# Patient Record
Sex: Male | Born: 1980 | Race: White | Hispanic: No | Marital: Single | Smoking: Current every day smoker
Health system: Southern US, Community
[De-identification: ages and names within clinical notes are randomized; demographics above are authoritative.]

## PROBLEM LIST (undated history)

## (undated) ENCOUNTER — Telehealth

## (undated) ENCOUNTER — Ambulatory Visit: Payer: PRIVATE HEALTH INSURANCE | Attending: Internal Medicine | Primary: Internal Medicine

## (undated) ENCOUNTER — Encounter

## (undated) ENCOUNTER — Ambulatory Visit: Payer: Medicaid (Managed Care)

## (undated) ENCOUNTER — Encounter: Attending: Mental Health | Primary: Mental Health

## (undated) ENCOUNTER — Ambulatory Visit: Payer: PRIVATE HEALTH INSURANCE

## (undated) ENCOUNTER — Ambulatory Visit

## (undated) ENCOUNTER — Ambulatory Visit: Payer: PRIVATE HEALTH INSURANCE | Attending: Adult Health | Primary: Adult Health

## (undated) ENCOUNTER — Encounter
Attending: Student in an Organized Health Care Education/Training Program | Primary: Student in an Organized Health Care Education/Training Program

## (undated) ENCOUNTER — Telehealth
Attending: Pharmacist Clinician (PhC)/ Clinical Pharmacy Specialist | Primary: Pharmacist Clinician (PhC)/ Clinical Pharmacy Specialist

## (undated) ENCOUNTER — Other Ambulatory Visit

## (undated) ENCOUNTER — Encounter: Attending: Family | Primary: Family

## (undated) ENCOUNTER — Encounter: Attending: Internal Medicine | Primary: Internal Medicine

## (undated) ENCOUNTER — Encounter
Attending: Pharmacist Clinician (PhC)/ Clinical Pharmacy Specialist | Primary: Pharmacist Clinician (PhC)/ Clinical Pharmacy Specialist

## (undated) ENCOUNTER — Encounter: Payer: PRIVATE HEALTH INSURANCE | Attending: Mental Health | Primary: Mental Health

## (undated) ENCOUNTER — Non-Acute Institutional Stay: Payer: PRIVATE HEALTH INSURANCE

## (undated) ENCOUNTER — Ambulatory Visit: Payer: PRIVATE HEALTH INSURANCE | Attending: Family | Primary: Family

## (undated) ENCOUNTER — Encounter: Payer: PRIVATE HEALTH INSURANCE | Attending: Gastroenterology | Primary: Gastroenterology

## (undated) ENCOUNTER — Ambulatory Visit: Attending: Mental Health | Primary: Mental Health

## (undated) DIAGNOSIS — M5430 Sciatica, unspecified side: Secondary | ICD-10-CM

## (undated) DIAGNOSIS — K760 Fatty (change of) liver, not elsewhere classified: Secondary | ICD-10-CM

## (undated) DIAGNOSIS — F1911 Other psychoactive substance abuse, in remission: Secondary | ICD-10-CM

## (undated) HISTORY — PX: WRIST FRACTURE SURGERY: SHX121

## (undated) HISTORY — PX: MOUTH SURGERY: SHX715

---

## 2010-01-28 ENCOUNTER — Emergency Department: Payer: Self-pay | Admitting: Emergency Medicine

## 2011-02-16 ENCOUNTER — Emergency Department: Payer: Self-pay | Admitting: *Deleted

## 2011-03-21 ENCOUNTER — Emergency Department (HOSPITAL_COMMUNITY)
Admission: EM | Admit: 2011-03-21 | Discharge: 2011-03-21 | Discharge status: Against Medical Advice | Payer: Self-pay | Attending: Emergency Medicine | Admitting: Emergency Medicine

## 2011-03-21 ENCOUNTER — Other Ambulatory Visit: Payer: Self-pay

## 2011-03-21 ENCOUNTER — Emergency Department (HOSPITAL_COMMUNITY): Payer: Self-pay

## 2011-03-21 DIAGNOSIS — R4182 Altered mental status, unspecified: Secondary | ICD-10-CM | POA: Insufficient documentation

## 2011-03-21 DIAGNOSIS — R0681 Apnea, not elsewhere classified: Secondary | ICD-10-CM | POA: Insufficient documentation

## 2011-03-21 DIAGNOSIS — F17200 Nicotine dependence, unspecified, uncomplicated: Secondary | ICD-10-CM | POA: Insufficient documentation

## 2011-03-21 DIAGNOSIS — K029 Dental caries, unspecified: Secondary | ICD-10-CM | POA: Insufficient documentation

## 2011-03-21 LAB — DIFFERENTIAL
Eosinophils Relative: 1 % (ref 0–5)
Lymphocytes Relative: 50 % — ABNORMAL HIGH (ref 12–46)
Monocytes Relative: 7 % (ref 3–12)
Neutrophils Relative %: 42 % — ABNORMAL LOW (ref 43–77)

## 2011-03-21 LAB — BASIC METABOLIC PANEL
CO2: 26 mEq/L (ref 19–32)
Calcium: 9.1 mg/dL (ref 8.4–10.5)
Chloride: 102 mEq/L (ref 96–112)
Potassium: 3.2 mEq/L — ABNORMAL LOW (ref 3.5–5.1)
Sodium: 137 mEq/L (ref 135–145)

## 2011-03-21 LAB — CBC
Hemoglobin: 13 g/dL (ref 13.0–17.0)
Platelets: 312 10*3/uL (ref 150–400)
RBC: 4.48 MIL/uL (ref 4.22–5.81)
WBC: 16.2 10*3/uL — ABNORMAL HIGH (ref 4.0–10.5)

## 2011-03-21 LAB — ETHANOL: Alcohol, Ethyl (B): <11 mg/dL (ref 0–11)

## 2011-03-21 MED ORDER — ZIPRASIDONE MESYLATE 20 MG IM SOLR
INTRAMUSCULAR | Status: AC
  Administered 2011-03-21: 18:00:00
  Filled 2011-03-21: qty 20

## 2011-03-21 MED ORDER — ZIPRASIDONE MESYLATE 20 MG IM SOLR
20.0000 mg | Freq: Once | INTRAMUSCULAR | Status: DC
  Filled 2011-03-21: qty 20

## 2011-03-21 NOTE — ED Notes (Signed)
Pt arrived with friends who found him unresponsive at home.  When pt first arrived he was ashen colored and had very shallow respirations.  He was slightly responsive to sternal rub.  He started to wake up quickly and was combative and hostile. He tore his cardiac leads off and his IV out. He was restrained temporarily for his safety and ours.  Security was also at the bedside.  Dr, Effie Shy was at the bedside.  O2 sats were 95% on room air after he woke up. Pt was continually reoriented to where he was and why he was here.

## 2011-03-21 NOTE — ED Notes (Signed)
Brought in by friends who have said they have been calling him all day.   Friends went to his home and found him unresponsive.

## 2011-03-21 NOTE — ED Notes (Signed)
Pt left without doing the discharge process.  He came back at 1955 to get his money that is locked in the safe.  Security was notified that he was here to get his money.

## 2011-03-21 NOTE — ED Notes (Signed)
Pts friends said that they tried to call him all day and he didn't answer so they rode by there and found him in the floor unresponsive, they put him in the car and drove him to the ED

## 2011-03-21 NOTE — ED Notes (Signed)
Pt unable to give a urine sample at this time.

## 2011-03-21 NOTE — ED Provider Notes (Signed)
History     CSN: [id number] Arrival date & time: 03/21/2011  4:57 PM   None   Level 5 Caveat Patient presented to the emergency room by friends who drove up to the ambulance bay. He was put in a wheelchair transferred immediately to the resuscitation bay. Initial assessment revealed he was apneic and pale with a grayish appearance. Palpable pulse in the groin in the neck and the heart rate greater than 100 auscultation and there was no visible trauma on his body. Initial treatment included 100% oxygen by face mask and bagging by me. An IV was obtained by nursing with blood obtained and a CBG was 183. After about 4 minutes of bagging the patient opened his eyes and looked around about 30 seconds after that, he sat up and began talking. He was confused and unable to cooperate; he required physical restraint and then had to be restrained with wrist and ankle devices. I ordered Geodon to control the situation until he could be further assessed.    Chief Complaint  Patient presents with  . Altered Mental Status    unresponsive    (Consider location/radiation/quality/duration/timing/severity/associated sxs/prior treatment) Patient is a 30 y.o. male presenting with altered mental status. The history is provided by the patient.  Altered Mental Status    No past medical history on file.  No past surgical history on file.  No family history on file.  History  Substance Use Topics  . Smoking status: Current Everyday Smoker  . Smokeless tobacco: Not on file  . Alcohol Use: Yes      Review of Systems  Unable to perform ROS Psychiatric/Behavioral: Positive for altered mental status.    Allergies  Acetaminophen  Home Medications  No current outpatient prescriptions on file.  BP 124/64  Pulse 104  Temp(Src) 98 F (36.7 C) (Oral)  SpO2 96%  Physical Exam  Constitutional: He appears well-developed.       Thin body habitus  HENT:  Head: Normocephalic and atraumatic.  Right  Ear: External ear normal.  Left Ear: External ear normal.       Jaw clenched on arrival. He has very poor dentition with multiple caries.  Eyes: Conjunctivae are normal. Pupils are equal, round, and reactive to light.       On presentation, he had a firm, right gaze preference.  Neck: Normal range of motion. Neck supple.  Cardiovascular: Normal rate and regular rhythm.   Pulmonary/Chest: Effort normal and breath sounds normal.  Abdominal: Soft. He exhibits no distension and no mass. There is no rebound and no guarding.  Musculoskeletal: Normal range of motion.  Neurological:       He is obtunded. He had responsiveness to a sternal rub by grimacing and moving towards the painful stimuli with his right arm.  Skin: Skin is warm.       He is diaphoretic    ED Course  Procedures (including critical care time) Initial resuscitation: Bagging improved. The patient's color, his oxygen saturation on 100% O2 was 95% (normal). Patient continued to struggle after awakening and not be coherent. He was restrained and given Geodon. 17:44 - The patient is cooperative at this time, responsive and follows directions. He is mildly sedated. Vital signs are normal 19:30- I was informed by nursing that the patient was taken out of his restraints, and then he left the emergency department AGAINST MEDICAL ADVICE.   Date: 03/22/2011  Rate: 94  Rhythm: normal sinus rhythm  QRS Axis: normal  Intervals: normal  ST/T Wave abnormalities: normal  Conduction Disutrbances:none  Narrative Interpretation:   Old EKG Reviewed: none available  CRITICAL CARE Performed by: Mancel Bale L   Total critical care time: 45  Critical care time was exclusive of separately billable procedures and treating other patients.  Critical care was necessary to treat or prevent imminent or life-threatening deterioration.  Critical care was time spent personally by me on the following activities: development of treatment plan with  patient and/or surrogate as well as nursing, discussions with consultants, evaluation of patient's response to treatment, examination of patient, obtaining history from patient or surrogate, ordering and performing treatments and interventions, ordering and review of laboratory studies, ordering and review of radiographic studies, pulse oximetry and re-evaluation of patient's condition. Labs Reviewed  CBC - Abnormal; Notable for the following:    WBC 16.2 (*)    All other components within normal limits  DIFFERENTIAL - Abnormal; Notable for the following:    Neutrophils Relative 42 (*)    Lymphocytes Relative 50 (*)    Lymphs Abs 8.1 (*)    Monocytes Absolute 1.1 (*)    All other components within normal limits  BASIC METABOLIC PANEL - Abnormal; Notable for the following:    Potassium 3.2 (*)    Glucose, Bld 197 (*)    All other components within normal limits  ETHANOL   Dg Chest Port 1 View  03/21/2011  *RADIOLOGY REPORT*  Clinical Data: Altered mental status, overdose.  PORTABLE CHEST - 1 VIEW  Comparison: None.  Findings: Trachea is midline.  Heart size normal.  Lungs are clear. No pleural fluid.  IMPRESSION: No acute findings.  Original Report Authenticated By: Reyes Ivan, M.D.     1. Altered mental status       MDM  Nonspecific altered mental status, incompletely evaluated. The patient left AGAINST MEDICAL ADVICE.        Flint Melter, MD 03/22/11 [phone]

## 2011-03-21 NOTE — ED Notes (Signed)
Pt left without letting the doctor see him again to discharge him.  He was awake, alert and was walking without difficulty.  His vital signs were stable before leaving.  Dr. Effie Shy was notified that the patient left.

## 2011-03-25 ENCOUNTER — Emergency Department: Payer: Self-pay | Admitting: Emergency Medicine

## 2012-02-13 ENCOUNTER — Emergency Department: Payer: Self-pay | Admitting: *Deleted

## 2012-02-14 LAB — DRUG SCREEN, URINE
Amphetamines, Ur Screen: POSITIVE (ref ?–1000)
Benzodiazepine, Ur Scrn: NEGATIVE (ref ?–200)
MDMA (Ecstasy)Ur Screen: NEGATIVE (ref ?–500)

## 2012-02-14 LAB — COMPREHENSIVE METABOLIC PANEL
Albumin: 4 g/dL (ref 3.4–5.0)
Anion Gap: 8 (ref 7–16)
BUN: 10 mg/dL (ref 7–18)
Bilirubin,Total: 0.8 mg/dL (ref 0.2–1.0)
Chloride: 103 mmol/L (ref 98–107)
Creatinine: 0.9 mg/dL (ref 0.60–1.30)
Glucose: 69 mg/dL (ref 65–99)
Potassium: 3.5 mmol/L (ref 3.5–5.1)
SGOT(AST): 40 U/L — ABNORMAL HIGH (ref 15–37)
SGPT (ALT): 72 U/L (ref 12–78)
Total Protein: 7.8 g/dL (ref 6.4–8.2)

## 2012-02-14 LAB — CBC
HCT: 42.9 % (ref 40.0–52.0)
HGB: 15.2 g/dL (ref 13.0–18.0)
MCH: 29.8 pg (ref 26.0–34.0)
MCHC: 35.3 g/dL (ref 32.0–36.0)

## 2012-02-14 LAB — URINALYSIS, COMPLETE
Bacteria: NONE SEEN
Bilirubin,UR: NEGATIVE
Blood: NEGATIVE
Ketone: NEGATIVE
Ph: 5 (ref 4.5–8.0)
Specific Gravity: 1.028 (ref 1.003–1.030)

## 2012-02-14 LAB — ETHANOL: Ethanol %: <0.003 % (ref 0.000–0.080)

## 2012-07-13 ENCOUNTER — Emergency Department: Payer: Self-pay | Admitting: Emergency Medicine

## 2013-08-03 ENCOUNTER — Emergency Department: Payer: Self-pay | Admitting: Emergency Medicine

## 2014-05-29 ENCOUNTER — Emergency Department: Payer: Self-pay | Admitting: Emergency Medicine

## 2014-05-29 LAB — CBC
HCT: 40.8 % (ref 40.0–52.0)
HGB: 13.7 g/dL (ref 13.0–18.0)
MCH: 28.2 pg (ref 26.0–34.0)
MCHC: 33.6 g/dL (ref 32.0–36.0)
MCV: 84 fL (ref 80–100)
Platelet: 201 10*3/uL (ref 150–440)
RBC: 4.87 10*6/uL (ref 4.40–5.90)
RDW: 14.1 % (ref 11.5–14.5)
WBC: 6.8 10*3/uL (ref 3.8–10.6)

## 2014-05-29 LAB — COMPREHENSIVE METABOLIC PANEL
ALT: 149 U/L — AB (ref 14–63)
ANION GAP: 5 — AB (ref 7–16)
AST: 73 U/L — AB (ref 15–37)
Albumin: 3.7 g/dL (ref 3.4–5.0)
Alkaline Phosphatase: 68 U/L (ref 46–116)
BUN: 11 mg/dL (ref 7–18)
Bilirubin,Total: 0.9 mg/dL (ref 0.2–1.0)
CALCIUM: 9 mg/dL (ref 8.5–10.1)
CHLORIDE: 106 mmol/L (ref 98–107)
CO2: 28 mmol/L (ref 21–32)
Creatinine: 0.89 mg/dL (ref 0.60–1.30)
Glucose: 92 mg/dL (ref 65–99)
Osmolality: 277 (ref 275–301)
Potassium: 4.4 mmol/L (ref 3.5–5.1)
Sodium: 139 mmol/L (ref 136–145)
Total Protein: 7.3 g/dL (ref 6.4–8.2)

## 2016-06-16 DIAGNOSIS — F419 Anxiety disorder, unspecified: Secondary | ICD-10-CM | POA: Insufficient documentation

## 2016-06-16 DIAGNOSIS — F1190 Opioid use, unspecified, uncomplicated: Secondary | ICD-10-CM | POA: Insufficient documentation

## 2016-07-14 DIAGNOSIS — B182 Chronic viral hepatitis C: Secondary | ICD-10-CM | POA: Insufficient documentation

## 2017-04-16 ENCOUNTER — Other Ambulatory Visit: Payer: Self-pay | Admitting: Family Medicine

## 2017-04-16 DIAGNOSIS — B182 Chronic viral hepatitis C: Secondary | ICD-10-CM

## 2017-05-04 ENCOUNTER — Ambulatory Visit: Payer: Medicaid Other

## 2017-10-20 DIAGNOSIS — F1910 Other psychoactive substance abuse, uncomplicated: Secondary | ICD-10-CM | POA: Insufficient documentation

## 2017-10-20 DIAGNOSIS — Z8619 Personal history of other infectious and parasitic diseases: Secondary | ICD-10-CM | POA: Insufficient documentation

## 2017-10-20 DIAGNOSIS — F17200 Nicotine dependence, unspecified, uncomplicated: Secondary | ICD-10-CM | POA: Insufficient documentation

## 2017-10-21 ENCOUNTER — Encounter: Admit: 2017-10-21 | Discharge: 2017-10-22 | Payer: PRIVATE HEALTH INSURANCE

## 2017-10-21 DIAGNOSIS — F1199 Opioid use, unspecified with unspecified opioid-induced disorder: Secondary | ICD-10-CM

## 2017-10-21 DIAGNOSIS — B182 Chronic viral hepatitis C: Secondary | ICD-10-CM

## 2017-10-21 DIAGNOSIS — F5104 Psychophysiologic insomnia: Secondary | ICD-10-CM

## 2017-10-21 DIAGNOSIS — F17200 Nicotine dependence, unspecified, uncomplicated: Principal | ICD-10-CM

## 2017-10-21 DIAGNOSIS — F1910 Other psychoactive substance abuse, uncomplicated: Secondary | ICD-10-CM

## 2017-10-21 MED ORDER — BUPRENORPHINE 8 MG-NALOXONE 2 MG SUBLINGUAL FILM
ORAL_FILM | 0 refills | 0 days | Status: CP
Start: 2017-10-21 — End: 2017-11-19

## 2017-11-19 ENCOUNTER — Encounter: Admit: 2017-11-19 | Discharge: 2017-11-20 | Payer: PRIVATE HEALTH INSURANCE

## 2017-11-19 DIAGNOSIS — F1910 Other psychoactive substance abuse, uncomplicated: Secondary | ICD-10-CM

## 2017-11-19 DIAGNOSIS — F17200 Nicotine dependence, unspecified, uncomplicated: Secondary | ICD-10-CM

## 2017-11-19 DIAGNOSIS — F5104 Psychophysiologic insomnia: Secondary | ICD-10-CM

## 2017-11-19 DIAGNOSIS — B182 Chronic viral hepatitis C: Secondary | ICD-10-CM

## 2017-11-19 DIAGNOSIS — F1199 Opioid use, unspecified with unspecified opioid-induced disorder: Principal | ICD-10-CM

## 2017-11-19 MED ORDER — BUPRENORPHINE 8 MG-NALOXONE 2 MG SUBLINGUAL FILM
ORAL_FILM | Freq: Three times a day (TID) | SUBLINGUAL | 0 refills | 0.00000 days | Status: CP
Start: 2017-11-19 — End: 2017-11-19

## 2017-11-19 MED ORDER — BUPRENORPHINE 8 MG-NALOXONE 2 MG SUBLINGUAL FILM: 1 | Film | Freq: Three times a day (TID) | 0 refills | 0 days | Status: AC

## 2017-12-17 ENCOUNTER — Encounter: Admit: 2017-12-17 | Discharge: 2017-12-18 | Payer: PRIVATE HEALTH INSURANCE

## 2017-12-17 DIAGNOSIS — F1199 Opioid use, unspecified with unspecified opioid-induced disorder: Principal | ICD-10-CM

## 2017-12-17 DIAGNOSIS — E162 Hypoglycemia, unspecified: Secondary | ICD-10-CM

## 2017-12-17 DIAGNOSIS — B182 Chronic viral hepatitis C: Secondary | ICD-10-CM

## 2017-12-17 DIAGNOSIS — F1910 Other psychoactive substance abuse, uncomplicated: Secondary | ICD-10-CM

## 2017-12-18 MED ORDER — BUPRENORPHINE 8 MG-NALOXONE 2 MG SUBLINGUAL FILM
ORAL_FILM | Freq: Three times a day (TID) | SUBLINGUAL | 0 refills | 0 days | Status: CP
Start: 2017-12-18 — End: 2018-01-15

## 2018-01-15 ENCOUNTER — Encounter: Admit: 2018-01-15 | Discharge: 2018-01-16 | Payer: PRIVATE HEALTH INSURANCE

## 2018-01-15 DIAGNOSIS — Z114 Encounter for screening for human immunodeficiency virus [HIV]: Secondary | ICD-10-CM

## 2018-01-15 DIAGNOSIS — R109 Unspecified abdominal pain: Secondary | ICD-10-CM

## 2018-01-15 DIAGNOSIS — K5903 Drug induced constipation: Secondary | ICD-10-CM

## 2018-01-15 DIAGNOSIS — F1910 Other psychoactive substance abuse, uncomplicated: Principal | ICD-10-CM

## 2018-01-15 DIAGNOSIS — F1199 Opioid use, unspecified with unspecified opioid-induced disorder: Secondary | ICD-10-CM

## 2018-01-15 DIAGNOSIS — B182 Chronic viral hepatitis C: Secondary | ICD-10-CM

## 2018-01-15 MED ORDER — BUPRENORPHINE 8 MG-NALOXONE 2 MG SUBLINGUAL FILM
ORAL_FILM | Freq: Three times a day (TID) | SUBLINGUAL | 0 refills | 0 days | Status: CP
Start: 2018-01-15 — End: 2018-02-10

## 2018-01-15 MED ORDER — POLYETHYLENE GLYCOL 3350 17 GRAM/DOSE ORAL POWDER
Freq: Every day | ORAL | 1 refills | 0.00000 days | Status: CP
Start: 2018-01-15 — End: 2018-03-16

## 2018-02-11 ENCOUNTER — Ambulatory Visit: Admit: 2018-02-11 | Discharge: 2018-02-12 | Payer: PRIVATE HEALTH INSURANCE

## 2018-02-11 DIAGNOSIS — B182 Chronic viral hepatitis C: Secondary | ICD-10-CM

## 2018-02-11 DIAGNOSIS — S20212A Contusion of left front wall of thorax, initial encounter: Secondary | ICD-10-CM

## 2018-02-11 DIAGNOSIS — Z114 Encounter for screening for human immunodeficiency virus [HIV]: Secondary | ICD-10-CM

## 2018-02-11 DIAGNOSIS — F1199 Opioid use, unspecified with unspecified opioid-induced disorder: Principal | ICD-10-CM

## 2018-02-11 DIAGNOSIS — R109 Unspecified abdominal pain: Secondary | ICD-10-CM

## 2018-02-11 DIAGNOSIS — F17200 Nicotine dependence, unspecified, uncomplicated: Secondary | ICD-10-CM

## 2018-02-11 DIAGNOSIS — R002 Palpitations: Secondary | ICD-10-CM

## 2018-02-11 DIAGNOSIS — F1910 Other psychoactive substance abuse, uncomplicated: Secondary | ICD-10-CM

## 2018-02-11 MED ORDER — BUPRENORPHINE 8 MG-NALOXONE 2 MG SUBLINGUAL FILM
ORAL_FILM | Freq: Three times a day (TID) | SUBLINGUAL | 1 refills | 0 days | Status: CP
Start: 2018-02-11 — End: 2018-04-08

## 2018-04-08 ENCOUNTER — Ambulatory Visit: Admit: 2018-04-08 | Discharge: 2018-04-09 | Payer: PRIVATE HEALTH INSURANCE

## 2018-04-08 DIAGNOSIS — F1199 Opioid use, unspecified with unspecified opioid-induced disorder: Principal | ICD-10-CM

## 2018-04-08 DIAGNOSIS — F17200 Nicotine dependence, unspecified, uncomplicated: Secondary | ICD-10-CM

## 2018-04-08 DIAGNOSIS — B182 Chronic viral hepatitis C: Secondary | ICD-10-CM

## 2018-04-08 DIAGNOSIS — R002 Palpitations: Secondary | ICD-10-CM

## 2018-04-08 DIAGNOSIS — S20212D Contusion of left front wall of thorax, subsequent encounter: Secondary | ICD-10-CM

## 2018-04-08 DIAGNOSIS — F1910 Other psychoactive substance abuse, uncomplicated: Secondary | ICD-10-CM

## 2018-04-08 MED ORDER — BUPRENORPHINE 8 MG-NALOXONE 2 MG SUBLINGUAL FILM
ORAL_FILM | Freq: Three times a day (TID) | SUBLINGUAL | 1 refills | 0 days | Status: CP
Start: 2018-04-08 — End: 2018-04-26

## 2018-04-26 ENCOUNTER — Encounter: Admit: 2018-04-26 | Discharge: 2018-04-27 | Payer: PRIVATE HEALTH INSURANCE

## 2018-04-26 DIAGNOSIS — F1199 Opioid use, unspecified with unspecified opioid-induced disorder: Principal | ICD-10-CM

## 2018-04-26 DIAGNOSIS — B182 Chronic viral hepatitis C: Secondary | ICD-10-CM

## 2018-04-26 DIAGNOSIS — F1910 Other psychoactive substance abuse, uncomplicated: Secondary | ICD-10-CM

## 2018-05-03 MED ORDER — NAFTIFINE 2 % TOPICAL CREAM
Freq: Every day | TOPICAL | 0 refills | 0.00000 days | Status: CP
Start: 2018-05-03 — End: 2018-05-31

## 2018-05-08 MED ORDER — BUPRENORPHINE 8 MG-NALOXONE 2 MG SUBLINGUAL FILM
ORAL_FILM | Freq: Three times a day (TID) | SUBLINGUAL | 0 refills | 0 days | Status: CP
Start: 2018-05-08 — End: 2018-05-12

## 2018-05-12 MED ORDER — BUPRENORPHINE 8 MG-NALOXONE 2 MG SUBLINGUAL FILM
ORAL_FILM | Freq: Three times a day (TID) | SUBLINGUAL | 0 refills | 0.00000 days | Status: CP
Start: 2018-05-12 — End: 2018-06-09

## 2018-06-09 ENCOUNTER — Encounter: Admit: 2018-06-09 | Discharge: 2018-06-10 | Payer: PRIVATE HEALTH INSURANCE

## 2018-06-09 DIAGNOSIS — F1199 Opioid use, unspecified with unspecified opioid-induced disorder: Secondary | ICD-10-CM

## 2018-06-09 DIAGNOSIS — F1910 Other psychoactive substance abuse, uncomplicated: Principal | ICD-10-CM

## 2018-06-09 DIAGNOSIS — B182 Chronic viral hepatitis C: Secondary | ICD-10-CM

## 2018-06-10 MED ORDER — BUPRENORPHINE 8 MG-NALOXONE 2 MG SUBLINGUAL FILM
ORAL_FILM | Freq: Three times a day (TID) | SUBLINGUAL | 0 refills | 0.00000 days | Status: CP
Start: 2018-06-10 — End: 2018-07-08

## 2018-06-28 ENCOUNTER — Emergency Department
Admission: EM | Admit: 2018-06-28 | Discharge: 2018-06-28 | Disposition: A | Discharge status: Home / Self Care | Payer: Medicaid Other | Attending: Emergency Medicine | Admitting: Emergency Medicine

## 2018-06-28 ENCOUNTER — Other Ambulatory Visit: Payer: Self-pay

## 2018-06-28 ENCOUNTER — Encounter: Payer: Self-pay | Admitting: Emergency Medicine

## 2018-06-28 DIAGNOSIS — Z79899 Other long term (current) drug therapy: Secondary | ICD-10-CM | POA: Insufficient documentation

## 2018-06-28 DIAGNOSIS — R509 Fever, unspecified: Secondary | ICD-10-CM | POA: Diagnosis present

## 2018-06-28 DIAGNOSIS — R6889 Other general symptoms and signs: Secondary | ICD-10-CM

## 2018-06-28 DIAGNOSIS — F17200 Nicotine dependence, unspecified, uncomplicated: Secondary | ICD-10-CM | POA: Diagnosis not present

## 2018-06-28 DIAGNOSIS — J219 Acute bronchiolitis, unspecified: Secondary | ICD-10-CM | POA: Diagnosis not present

## 2018-06-28 MED ORDER — AZITHROMYCIN 250 MG PO TABS: ORAL_TABLET | ORAL | 0 refills | Status: DC

## 2018-06-28 MED ORDER — BENZONATATE 200 MG PO CAPS: 200.0000 mg | ORAL_CAPSULE | Freq: Three times a day (TID) | ORAL | 0 refills | Status: DC | PRN

## 2018-06-28 NOTE — ED Triage Notes (Signed)
Patient complaining of nasal congestion, body aches, HA, and cough X 2 days.  Productive cough of green mucus.  States he got his flu shot on 06/10/18.  Alert and oriented, color good.  Wearing adult face mask.

## 2018-06-28 NOTE — ED Provider Notes (Signed)
[facility] Emergency Department Provider Note  ____________________________________________   First MD Initiated Contact with Patient 06/28/18 1127     (approximate)  I have reviewed the triage vital signs and the nursing notes.   HISTORY  Chief Complaint Cough; Nasal Congestion; Headache; and Generalized Body Aches    HPI Jared Trujillo is a 38 y.o. male presents emergency department with flulike symptoms, patient is complained of fever, chills, body aches.  Productive cough with yellow and green mucus, denies sore throat, denies vomiting, denies diarrhea; denies chest pain or sob.  Sx for 2 days, he states he did get a flu shot on February 13.   History reviewed. No pertinent past medical history.  There are no active problems to display for this patient.   Past Surgical History:  Procedure Laterality Date  . MOUTH SURGERY    . WRIST FRACTURE SURGERY Left     Prior to Admission medications   Medication Sig Start Date End Date Taking? Authorizing Provider  buprenorphine-naloxone (SUBOXONE) 8-2 mg SUBL SL tablet Place 1 tablet under the tongue daily.   Yes [provider]  azithromycin (ZITHROMAX Z-PAK) 250 MG tablet 2 pills today then 1 pill a day for 4 days 06/28/18   Sherrie Mustache Roselyn Bering, PA-C  benzonatate (TESSALON) 200 MG capsule Take 1 capsule (200 mg total) by mouth 3 (three) times daily as needed for cough. 06/28/18   Faythe Ghee, PA-C    Allergies Acetaminophen  No family history on file.  Social History Social History   Tobacco Use  . Smoking status: Current Every Day Smoker  . Smokeless tobacco: Current User  Substance Use Topics  . Alcohol use: Not Currently  . Drug use: Not Currently    Types: IV    Review of Systems  Constitutional: Positive fever/chills Eyes: No visual changes. ENT: No sore throat. Respiratory: Positive for productive cough Genitourinary: Negative for dysuria. Musculoskeletal: Negative for  back pain. Skin: Negative for rash.    ____________________________________________   PHYSICAL EXAM:  VITAL SIGNS: ED Triage Vitals  Enc Vitals Group     BP 06/28/18 1102 115/80     Pulse Rate 06/28/18 1102 84     Resp 06/28/18 1102 16     Temp 06/28/18 1102 98.9 F (37.2 C)     Temp Source 06/28/18 1102 Oral     SpO2 06/28/18 1102 99 %     Weight 06/28/18 1103 145 lb (65.8 kg)     Height 06/28/18 1103 5\' 9"  (1.753 m)     Head Circumference --      Peak Flow --      Pain Score 06/28/18 1103 5     Pain Loc --      Pain Edu? --      Excl. in GC? --     Constitutional: Alert and oriented. Well appearing and in no acute distress. Eyes: Conjunctivae are normal.  Head: Atraumatic. Nose: No congestion/rhinnorhea. Mouth/Throat: Mucous membranes are moist.  Throat appears normal Neck:  supple no lymphadenopathy noted Cardiovascular: Normal rate, regular rhythm. Heart sounds are normal Respiratory: Normal respiratory effort.  No retractions, lungs c t a  GU: deferred Musculoskeletal: FROM all extremities, warm and well perfused Neurologic:  Normal speech and language.  Skin:  Skin is warm, dry and intact. No rash noted. Psychiatric: Mood and affect are normal. Speech and behavior are normal.  ____________________________________________   LABS (all labs ordered are listed, but only abnormal results are displayed)  Labs Reviewed - No data to display ____________________________________________   ____________________________________________  RADIOLOGY    ____________________________________________   PROCEDURES  Procedure(s) performed: No  Procedures    ____________________________________________   INITIAL IMPRESSION / ASSESSMENT AND PLAN / ED COURSE  Pertinent labs & imaging results that were available during my care of the patient were reviewed by me and considered in my medical decision making (see chart for details).   Patient is 38 year old male  presents emergency department with flulike symptoms..    Physical exam shows a wet cough.    Due to the discolored mucus throughout the day feel he has an acute bronchitis.  Explained to him he could have bronchitis along with flu.  However we are not testing adults at this time.  He is to take Tylenol and ibuprofen for fever as needed.  He was given a prescription for Z-Pak and Tessalon Perles.  He is to drink fluids, take Tylenol and ibuprofen as needed.  TheraFlu for body aches.  States he understands will comply.  Is discharged stable condition.     As part of my medical decision making, I reviewed the following data within the electronic MEDICAL RECORD NUMBER Nursing notes reviewed and incorporated, Old chart reviewed, Notes from prior ED visits and [place] Controlled Substance Database  ____________________________________________   FINAL CLINICAL IMPRESSION(S) / ED DIAGNOSES  Final diagnoses:  Acute bronchiolitis due to unspecified organism  Flu-like symptoms      NEW MEDICATIONS STARTED DURING THIS VISIT:  New Prescriptions   AZITHROMYCIN (ZITHROMAX Z-PAK) 250 MG TABLET    2 pills today then 1 pill a day for 4 days   BENZONATATE (TESSALON) 200 MG CAPSULE    Take 1 capsule (200 mg total) by mouth 3 (three) times daily as needed for cough.     Note:  This document was prepared using Dragon voice recognition software and may include unintentional dictation errors.    Faythe Ghee, PA-C 06/28/18 1352    Nita Sickle, MD 06/30/18 [phone]

## 2018-06-28 NOTE — ED Notes (Signed)
See triage note  Presents with body aches ,subjective fever cough  States sxs; started 2 days ago  Afebrile on arrival

## 2018-06-28 NOTE — Discharge Instructions (Addendum)
Follow-up with your regular doctor if not better in 3 to 5 days.  Return emergency department if worsening.  Take the medications as prescribed.  Take Tylenol and ibuprofen for fever if needed.  Plenty of fluids to help with body aches.  Over-the-counter TheraFlu will also help with the body aches.

## 2018-07-08 ENCOUNTER — Encounter: Admit: 2018-07-08 | Discharge: 2018-07-09 | Payer: PRIVATE HEALTH INSURANCE

## 2018-07-08 DIAGNOSIS — R05 Cough: Principal | ICD-10-CM

## 2018-07-08 DIAGNOSIS — F1910 Other psychoactive substance abuse, uncomplicated: Principal | ICD-10-CM

## 2018-07-08 DIAGNOSIS — F1199 Opioid use, unspecified with unspecified opioid-induced disorder: Principal | ICD-10-CM

## 2018-07-08 DIAGNOSIS — B182 Chronic viral hepatitis C: Principal | ICD-10-CM

## 2018-07-08 MED ORDER — PREDNISONE 20 MG TABLET
ORAL_TABLET | Freq: Every day | ORAL | 0 refills | 0.00000 days | Status: CP
Start: 2018-07-08 — End: 2018-07-13

## 2018-07-08 MED ORDER — BUPRENORPHINE 8 MG-NALOXONE 2 MG SUBLINGUAL FILM
ORAL_FILM | Freq: Three times a day (TID) | SUBLINGUAL | 0 refills | 0 days | Status: CP
Start: 2018-07-08 — End: 2018-07-26

## 2018-07-26 DIAGNOSIS — F1199 Opioid use, unspecified with unspecified opioid-induced disorder: Principal | ICD-10-CM

## 2018-07-26 DIAGNOSIS — F1910 Other psychoactive substance abuse, uncomplicated: Principal | ICD-10-CM

## 2018-08-07 MED ORDER — BUPRENORPHINE 8 MG-NALOXONE 2 MG SUBLINGUAL FILM
ORAL_FILM | Freq: Three times a day (TID) | SUBLINGUAL | 0 refills | 0.00000 days | Status: CP
Start: 2018-08-07 — End: 2018-08-19

## 2018-08-19 ENCOUNTER — Encounter: Admit: 2018-08-19 | Discharge: 2018-08-20 | Payer: PRIVATE HEALTH INSURANCE

## 2018-08-19 DIAGNOSIS — B182 Chronic viral hepatitis C: Secondary | ICD-10-CM

## 2018-08-19 DIAGNOSIS — F1910 Other psychoactive substance abuse, uncomplicated: Principal | ICD-10-CM

## 2018-08-19 DIAGNOSIS — F17200 Nicotine dependence, unspecified, uncomplicated: Secondary | ICD-10-CM

## 2018-08-19 DIAGNOSIS — F1199 Opioid use, unspecified with unspecified opioid-induced disorder: Secondary | ICD-10-CM

## 2018-09-02 MED ORDER — BUPRENORPHINE 8 MG-NALOXONE 2 MG SUBLINGUAL FILM
ORAL_FILM | Freq: Three times a day (TID) | SUBLINGUAL | 0 refills | 0 days | Status: CP
Start: 2018-09-02 — End: 2018-09-22

## 2018-09-23 ENCOUNTER — Encounter: Admit: 2018-09-23 | Discharge: 2018-09-24 | Payer: PRIVATE HEALTH INSURANCE

## 2018-09-23 DIAGNOSIS — F1199 Opioid use, unspecified with unspecified opioid-induced disorder: Secondary | ICD-10-CM

## 2018-09-23 DIAGNOSIS — F1910 Other psychoactive substance abuse, uncomplicated: Principal | ICD-10-CM

## 2018-09-25 MED ORDER — BUPRENORPHINE 8 MG-NALOXONE 2 MG SUBLINGUAL FILM
ORAL_FILM | Freq: Three times a day (TID) | SUBLINGUAL | 0 refills | 0 days | Status: CP
Start: 2018-09-25 — End: 2018-10-22

## 2018-10-22 ENCOUNTER — Ambulatory Visit: Admit: 2018-10-22 | Discharge: 2018-10-23 | Payer: PRIVATE HEALTH INSURANCE

## 2018-10-22 DIAGNOSIS — B182 Chronic viral hepatitis C: Secondary | ICD-10-CM

## 2018-10-22 DIAGNOSIS — F17200 Nicotine dependence, unspecified, uncomplicated: Secondary | ICD-10-CM

## 2018-10-22 DIAGNOSIS — F1199 Opioid use, unspecified with unspecified opioid-induced disorder: Secondary | ICD-10-CM

## 2018-10-22 DIAGNOSIS — F1910 Other psychoactive substance abuse, uncomplicated: Principal | ICD-10-CM

## 2018-10-24 MED ORDER — BUPRENORPHINE 8 MG-NALOXONE 2 MG SUBLINGUAL FILM
ORAL_FILM | Freq: Three times a day (TID) | SUBLINGUAL | 0 refills | 0 days | Status: CP
Start: 2018-10-24 — End: 2018-11-19

## 2018-11-19 ENCOUNTER — Ambulatory Visit: Admit: 2018-11-19 | Discharge: 2018-11-20 | Payer: PRIVATE HEALTH INSURANCE

## 2018-11-19 DIAGNOSIS — F1199 Opioid use, unspecified with unspecified opioid-induced disorder: Principal | ICD-10-CM

## 2018-11-19 DIAGNOSIS — B182 Chronic viral hepatitis C: Secondary | ICD-10-CM

## 2018-11-19 DIAGNOSIS — F1910 Other psychoactive substance abuse, uncomplicated: Secondary | ICD-10-CM

## 2018-11-19 DIAGNOSIS — Z659 Problem related to unspecified psychosocial circumstances: Secondary | ICD-10-CM

## 2018-11-20 DIAGNOSIS — H539 Unspecified visual disturbance: Secondary | ICD-10-CM | POA: Insufficient documentation

## 2018-11-21 MED ORDER — BUPRENORPHINE 8 MG-NALOXONE 2 MG SUBLINGUAL FILM
ORAL_FILM | Freq: Three times a day (TID) | SUBLINGUAL | 0 refills | 30.00000 days | Status: CP
Start: 2018-11-21 — End: 2018-12-15

## 2018-12-01 ENCOUNTER — Encounter: Admit: 2018-12-01 | Discharge: 2018-12-01 | Payer: PRIVATE HEALTH INSURANCE

## 2018-12-01 ENCOUNTER — Encounter
Admit: 2018-12-01 | Discharge: 2018-12-01 | Payer: PRIVATE HEALTH INSURANCE | Attending: Mental Health | Primary: Mental Health

## 2018-12-01 DIAGNOSIS — F1199 Opioid use, unspecified with unspecified opioid-induced disorder: Principal | ICD-10-CM

## 2018-12-01 DIAGNOSIS — F1910 Other psychoactive substance abuse, uncomplicated: Secondary | ICD-10-CM

## 2018-12-01 DIAGNOSIS — B182 Chronic viral hepatitis C: Secondary | ICD-10-CM

## 2018-12-16 DIAGNOSIS — F1910 Other psychoactive substance abuse, uncomplicated: Secondary | ICD-10-CM

## 2018-12-16 DIAGNOSIS — F22 Delusional disorders: Secondary | ICD-10-CM

## 2018-12-16 DIAGNOSIS — B182 Chronic viral hepatitis C: Principal | ICD-10-CM

## 2018-12-16 DIAGNOSIS — F1199 Opioid use, unspecified with unspecified opioid-induced disorder: Secondary | ICD-10-CM

## 2018-12-20 MED ORDER — BUPRENORPHINE 8 MG-NALOXONE 2 MG SUBLINGUAL FILM
ORAL_FILM | Freq: Three times a day (TID) | SUBLINGUAL | 0 refills | 30 days | Status: CP
Start: 2018-12-20 — End: 2019-01-19

## 2019-01-10 ENCOUNTER — Encounter: Admit: 2019-01-10 | Discharge: 2019-01-11 | Payer: PRIVATE HEALTH INSURANCE

## 2019-01-10 DIAGNOSIS — F1199 Opioid use, unspecified with unspecified opioid-induced disorder: Secondary | ICD-10-CM

## 2019-01-10 DIAGNOSIS — B182 Chronic viral hepatitis C: Secondary | ICD-10-CM

## 2019-01-10 DIAGNOSIS — F1910 Other psychoactive substance abuse, uncomplicated: Secondary | ICD-10-CM

## 2019-01-10 DIAGNOSIS — F22 Delusional disorders: Secondary | ICD-10-CM

## 2019-01-16 MED ORDER — BUPRENORPHINE 8 MG-NALOXONE 2 MG SUBLINGUAL FILM
ORAL_FILM | Freq: Three times a day (TID) | SUBLINGUAL | 0 refills | 30.00000 days | Status: CP
Start: 2019-01-16 — End: 2019-02-06

## 2019-02-07 ENCOUNTER — Encounter: Admit: 2019-02-07 | Discharge: 2019-02-08 | Payer: PRIVATE HEALTH INSURANCE

## 2019-02-07 DIAGNOSIS — M545 Low back pain: Principal | ICD-10-CM

## 2019-02-07 DIAGNOSIS — F1199 Opioid use, unspecified with unspecified opioid-induced disorder: Principal | ICD-10-CM

## 2019-02-07 DIAGNOSIS — F1910 Other psychoactive substance abuse, uncomplicated: Principal | ICD-10-CM

## 2019-02-07 DIAGNOSIS — F22 Delusional disorders: Principal | ICD-10-CM

## 2019-02-07 DIAGNOSIS — G8929 Other chronic pain: Secondary | ICD-10-CM | POA: Insufficient documentation

## 2019-02-07 MED ORDER — IBUPROFEN 800 MG TABLET: 800 mg | tablet | Freq: Three times a day (TID) | 0 refills | 30 days | Status: AC

## 2019-02-07 MED ORDER — BUPRENORPHINE 8 MG-NALOXONE 2 MG SUBLINGUAL FILM: 1 | Film | Freq: Three times a day (TID) | 0 refills | 30 days | Status: AC

## 2019-02-07 MED ORDER — CYCLOBENZAPRINE 10 MG TABLET: 10 mg | tablet | Freq: Three times a day (TID) | 0 refills | 10 days | Status: AC

## 2019-03-10 ENCOUNTER — Encounter: Admit: 2019-03-10 | Discharge: 2019-03-11 | Payer: PRIVATE HEALTH INSURANCE

## 2019-03-10 DIAGNOSIS — F17200 Nicotine dependence, unspecified, uncomplicated: Principal | ICD-10-CM

## 2019-03-10 DIAGNOSIS — B182 Chronic viral hepatitis C: Principal | ICD-10-CM

## 2019-03-10 DIAGNOSIS — F22 Delusional disorders: Principal | ICD-10-CM

## 2019-03-10 DIAGNOSIS — F1910 Other psychoactive substance abuse, uncomplicated: Principal | ICD-10-CM

## 2019-03-10 DIAGNOSIS — Z9181 History of falling: Principal | ICD-10-CM

## 2019-03-10 DIAGNOSIS — F1199 Opioid use, unspecified with unspecified opioid-induced disorder: Principal | ICD-10-CM

## 2019-03-10 DIAGNOSIS — R102 Pelvic and perineal pain: Principal | ICD-10-CM

## 2019-03-10 MED ORDER — NICOTINE 21 MG/24 HR DAILY TRANSDERMAL PATCH
MEDICATED_PATCH | TRANSDERMAL | 0 refills | 42 days | Status: CP
Start: 2019-03-10 — End: 2019-04-21

## 2019-03-10 MED ORDER — BUPRENORPHINE 8 MG-NALOXONE 2 MG SUBLINGUAL FILM
ORAL_FILM | Freq: Three times a day (TID) | SUBLINGUAL | 0 refills | 30 days | Status: CP
Start: 2019-03-10 — End: 2019-04-09

## 2019-03-29 ENCOUNTER — Emergency Department: Payer: Medicaid Other

## 2019-03-29 ENCOUNTER — Other Ambulatory Visit: Payer: Self-pay

## 2019-03-29 ENCOUNTER — Encounter: Payer: Self-pay | Admitting: Emergency Medicine

## 2019-03-29 ENCOUNTER — Emergency Department
Admission: EM | Admit: 2019-03-29 | Discharge: 2019-03-29 | Disposition: A | Discharge status: Home / Self Care | Payer: Medicaid Other | Attending: Emergency Medicine | Admitting: Emergency Medicine

## 2019-03-29 DIAGNOSIS — X500XXA Overexertion from strenuous movement or load, initial encounter: Secondary | ICD-10-CM | POA: Diagnosis not present

## 2019-03-29 DIAGNOSIS — S76211A Strain of adductor muscle, fascia and tendon of right thigh, initial encounter: Secondary | ICD-10-CM | POA: Insufficient documentation

## 2019-03-29 DIAGNOSIS — F17290 Nicotine dependence, other tobacco product, uncomplicated: Secondary | ICD-10-CM | POA: Diagnosis not present

## 2019-03-29 DIAGNOSIS — Z79899 Other long term (current) drug therapy: Secondary | ICD-10-CM | POA: Insufficient documentation

## 2019-03-29 DIAGNOSIS — Y9389 Activity, other specified: Secondary | ICD-10-CM | POA: Insufficient documentation

## 2019-03-29 DIAGNOSIS — S76212A Strain of adductor muscle, fascia and tendon of left thigh, initial encounter: Secondary | ICD-10-CM | POA: Diagnosis not present

## 2019-03-29 DIAGNOSIS — S3992XA Unspecified injury of lower back, initial encounter: Secondary | ICD-10-CM | POA: Diagnosis present

## 2019-03-29 DIAGNOSIS — Y999 Unspecified external cause status: Secondary | ICD-10-CM | POA: Diagnosis not present

## 2019-03-29 DIAGNOSIS — N50812 Left testicular pain: Secondary | ICD-10-CM

## 2019-03-29 DIAGNOSIS — Y9289 Other specified places as the place of occurrence of the external cause: Secondary | ICD-10-CM | POA: Diagnosis not present

## 2019-03-29 DIAGNOSIS — S39012A Strain of muscle, fascia and tendon of lower back, initial encounter: Secondary | ICD-10-CM | POA: Insufficient documentation

## 2019-03-29 DIAGNOSIS — S76219A Strain of adductor muscle, fascia and tendon of unspecified thigh, initial encounter: Secondary | ICD-10-CM

## 2019-03-29 LAB — URINALYSIS, COMPLETE (UACMP) WITH MICROSCOPIC
Bacteria, UA: NONE SEEN
Bilirubin Urine: NEGATIVE
Glucose, UA: NEGATIVE mg/dL
Hgb urine dipstick: NEGATIVE
Ketones, ur: NEGATIVE mg/dL
Leukocytes,Ua: NEGATIVE
Nitrite: NEGATIVE
Protein, ur: NEGATIVE mg/dL
Specific Gravity, Urine: 1.014 (ref 1.005–1.030)
Squamous Epithelial / LPF: NONE SEEN (ref 0–5)
pH: 7 (ref 5.0–8.0)

## 2019-03-29 MED ORDER — OXYCODONE HCL 5 MG PO TABS
10.0000 mg | ORAL_TABLET | Freq: Once | ORAL | Status: AC
  Administered 2019-03-29: 10 mg via ORAL
  Filled 2019-03-29: qty 2

## 2019-03-29 MED ORDER — METHOCARBAMOL 500 MG PO TABS: 500.0000 mg | ORAL_TABLET | Freq: Four times a day (QID) | ORAL | 0 refills | Status: DC | PRN

## 2019-03-29 MED ORDER — OXYCODONE HCL 5 MG PO TABS: 5.0000 mg | ORAL_TABLET | Freq: Four times a day (QID) | ORAL | 0 refills | Status: DC | PRN

## 2019-03-29 NOTE — Discharge Instructions (Addendum)
Follow-up with your primary care provider if any continued problems or concerns.  Return to the emergency department if any severe worsening of your symptoms.  Begin taking medication as directed.  The methocarbamol and the oxycodone together may make you extremely drowsy.  Do not plan to drive or operate a machinery while taking medication.  You may use ice or heat to your back as needed for discomfort.

## 2019-03-29 NOTE — ED Notes (Signed)
See triage note  Presents with low back pain  States he also has a pulling type pain in testicles   States he was lifting and turning a boat  Then felt pop in back  Ambulates slowly d/t pain

## 2019-03-29 NOTE — ED Provider Notes (Signed)
[facility] Emergency Department Provider Note   ____________________________________________   First MD Initiated Contact with Patient 03/29/19 1446     (approximate)  I have reviewed the triage vital signs and the nursing notes.   HISTORY  Chief Complaint Back Pain   HPI Jared Trujillo is a 38 y.o. male presents to the ED with complaint of low back pain.  Patient states that he was lifting a boat to hitch at and felt a pop in his low back.  He now describes a pulling sensation in his lower back and also the same sensation in bilateral testicle and lower abdominal area.  Patient was able to drive himself to the ED.  He rates his pain as 10/10.       History reviewed. No pertinent past medical history.  There are no active problems to display for this patient.   Past Surgical History:  Procedure Laterality Date  . MOUTH SURGERY    . WRIST FRACTURE SURGERY Left     Prior to Admission medications   Medication Sig Start Date End Date Taking? Authorizing Provider  ARIPiprazole (ABILIFY) 10 MG tablet Take 10 mg by mouth daily.   Yes [provider]  buprenorphine-naloxone (SUBOXONE) 8-2 mg SUBL SL tablet Place 1 tablet under the tongue daily.    [provider]  methocarbamol (ROBAXIN) 500 MG tablet Take 1 tablet (500 mg total) by mouth every 6 (six) hours as needed. 03/29/19   Tommi Rumps, PA-C  oxyCODONE (OXY IR/ROXICODONE) 5 MG immediate release tablet Take 1 tablet (5 mg total) by mouth every 6 (six) hours as needed for severe pain. 03/29/19   Tommi Rumps, PA-C    Allergies Acetaminophen  No family history on file.  Social History Social History   Tobacco Use  . Smoking status: Current Every Day Smoker  . Smokeless tobacco: Current User  Substance Use Topics  . Alcohol use: Not Currently  . Drug use: Not Currently    Types: IV    Review of Systems Constitutional: No fever/chills Cardiovascular: Denies  chest pain. Respiratory: Denies shortness of breath. Gastrointestinal: No abdominal pain.  No nausea, no vomiting.   Genitourinary: Positive bilateral scrotal pain. Musculoskeletal: Positive for low back pain. Skin: Negative for rash. Neurological: Negative for headaches, focal weakness or numbness.  ____________________________________________   PHYSICAL EXAM:  VITAL SIGNS: ED Triage Vitals  Enc Vitals Group     BP 03/29/19 1404 127/71     Pulse Rate 03/29/19 1404 98     Resp 03/29/19 1404 18     Temp 03/29/19 1404 98.8 F (37.1 C)     Temp Source 03/29/19 1404 Oral     SpO2 03/29/19 1404 100 %     Weight 03/29/19 1401 148 lb (67.1 kg)     Height 03/29/19 1401 5\' 8"  (1.727 m)     Head Circumference --      Peak Flow --      Pain Score 03/29/19 1401 10     Pain Loc --      Pain Edu? --      Excl. in GC? --     Constitutional: Alert and oriented. Well appearing and in no acute distress. Eyes: Conjunctivae are normal.  Head: Atraumatic. Neck: No stridor.   Cardiovascular: Normal rate, regular rhythm. Grossly normal heart sounds.  Good peripheral circulation. Respiratory: Normal respiratory effort.  No retractions. Lungs CTAB. Gastrointestinal: Soft and nontender. No distention.  Bowel sounds normoactive x4  quadrants.  Abdomen is flat with no point tenderness on palpation. Musculoskeletal: On examination of his low back there is tenderness of the paravertebral muscles approximately L5-S1 area but no point tenderness on palpation of the vertebral bodies.  No step-offs were noted.  Range of motion is slow and guarded.  Patient also is able to ambulate without assistance but is slow and guarded.  Good muscle strength bilaterally. Neurologic:  Normal speech and language. No gross focal neurologic deficits are appreciated. No gait instability. Skin:  Skin is warm, dry and intact. No rash noted. Psychiatric: Mood and affect are normal. Speech and behavior are normal.   ____________________________________________   LABS (all labs ordered are listed, but only abnormal results are displayed)  Labs Reviewed  URINALYSIS, COMPLETE (UACMP) WITH MICROSCOPIC - Abnormal; Notable for the following components:      Result Value   Color, Urine YELLOW (*)    APPearance CLEAR (*)    All other components within normal limits   _ RADIOLOGY   Official radiology report(s): Dg Lumbar Spine 2-3 Views  Result Date: 03/29/2019 CLINICAL DATA:  Low back pain following heavy lifting, initial encounter EXAM: LUMBAR SPINE - 2-3 VIEW COMPARISON:  None. FINDINGS: Five lumbar type vertebral bodies are well visualized. Vertebral body height is well maintained. No compression deformity is noted. No anterolisthesis is seen. No soft tissue abnormality is noted. IMPRESSION: No acute abnormality noted. Electronically Signed   By: Inez Catalina M.D.   On: 03/29/2019 15:44   US Scrotum W/doppler  Result Date: 03/29/2019 CLINICAL DATA:  38 year old male with bilateral testicular pain. EXAM: SCROTAL ULTRASOUND DOPPLER ULTRASOUND OF THE TESTICLES TECHNIQUE: Complete ultrasound examination of the testicles, epididymis, and other scrotal structures was performed. Color and spectral Doppler ultrasound were also utilized to evaluate blood flow to the testicles. COMPARISON:  None. FINDINGS: Right testicle Measurements: 4.1 x 1.8 x 3.1 cm. No mass or microlithiasis visualized. Left testicle Measurements: 4.3 x 1.9 x 2.6 cm. No mass or microlithiasis visualized. Right epididymis: Normal in size and appearance. A 2 mm right epididymal head cyst. Left epididymis:  Normal in size and appearance. Hydrocele:  Minimal bilateral hydroceles. Varicocele:  None visualized. Pulsed Doppler interrogation of both testes demonstrates normal low resistance arterial and venous waveforms bilaterally. IMPRESSION: Unremarkable testicular ultrasound. Electronically Signed   By: Anner Crete M.D.   On: 03/29/2019 16:59     ____________________________________________   PROCEDURES  Procedure(s) performed (including Critical Care):  Procedures   ____________________________________________   INITIAL IMPRESSION / ASSESSMENT AND PLAN / ED COURSE  As part of my medical decision making, I reviewed the following data within the electronic MEDICAL RECORD NUMBER Notes from prior ED visits and [place] Controlled Substance Database  38 year old male presents to the ED with complaint of low back pain with radiation around to the bilateral scrotal area.  Patient states that he was lifting the trailer of a boat to hip sit up when the pain started.  He denies any saddle anesthesias, incontinence of bowel or bladder or radiculopathy.  Patient has continued to ambulate without any assistance.  He was given oxycodone 5 mg while in the ED.  X-rays were negative for any acute bony injury and ultrasound did not show any acute changes.  Patient was made aware that most likely this is a strain to the groin and also lumbar spine.  Patient was given prescription for both methocarbamol 500 mg every 6 hours and oxycodone.  He is aware that he cannot drive or  operate machinery while taking this medication.  He is to see his PCP if any continued problems.  If any worsening of his symptoms or urgent concerns he is to return to the emergency department.   ____________________________________________   FINAL CLINICAL IMPRESSION(S) / ED DIAGNOSES  Final diagnoses:  Strain of lumbar region, initial encounter  Groin strain, unspecified laterality, initial encounter     ED Discharge Orders         Ordered    oxyCODONE (OXY IR/ROXICODONE) 5 MG immediate release tablet  Every 6 hours PRN     03/29/19 1709    methocarbamol (ROBAXIN) 500 MG tablet  Every 6 hours PRN     03/29/19 1709           Note:  This document was prepared using Dragon voice recognition software and may include unintentional dictation errors.    Tommi RumpsSummers,  L,  PA-C 03/29/19 1751    Dionne BucySiadecki, Sebastian, MD 03/30/19 1221

## 2019-03-29 NOTE — ED Triage Notes (Signed)
Pt reports was moving a boat today by lifting the hitch and rolling it and he felt a pop and now has pain in his back and a pulling sensation in his testicle and abd.

## 2019-03-29 NOTE — ED Notes (Signed)
Pt otf for imaging 

## 2019-04-06 ENCOUNTER — Encounter: Admit: 2019-04-06 | Discharge: 2019-04-07 | Payer: PRIVATE HEALTH INSURANCE

## 2019-04-06 DIAGNOSIS — M545 Low back pain: Principal | ICD-10-CM

## 2019-04-06 DIAGNOSIS — F1199 Opioid use, unspecified with unspecified opioid-induced disorder: Principal | ICD-10-CM

## 2019-04-06 DIAGNOSIS — F1910 Other psychoactive substance abuse, uncomplicated: Principal | ICD-10-CM

## 2019-04-06 MED ORDER — CYCLOBENZAPRINE 10 MG TABLET
ORAL_TABLET | Freq: Three times a day (TID) | ORAL | 0 refills | 10 days | Status: CP | PRN
Start: 2019-04-06 — End: 2019-05-06

## 2019-04-07 MED ORDER — BUPRENORPHINE 8 MG-NALOXONE 2 MG SUBLINGUAL FILM
ORAL_FILM | Freq: Three times a day (TID) | SUBLINGUAL | 0 refills | 30 days | Status: CP
Start: 2019-04-07 — End: 2019-05-07

## 2019-05-06 ENCOUNTER — Encounter: Admit: 2019-05-06 | Discharge: 2019-05-07 | Payer: PRIVATE HEALTH INSURANCE

## 2019-05-06 DIAGNOSIS — M5441 Lumbago with sciatica, right side: Secondary | ICD-10-CM

## 2019-05-06 DIAGNOSIS — G8929 Other chronic pain: Secondary | ICD-10-CM

## 2019-05-06 DIAGNOSIS — F1199 Opioid use, unspecified with unspecified opioid-induced disorder: Principal | ICD-10-CM

## 2019-05-06 DIAGNOSIS — F1910 Other psychoactive substance abuse, uncomplicated: Principal | ICD-10-CM

## 2019-05-06 DIAGNOSIS — M5442 Lumbago with sciatica, left side: Secondary | ICD-10-CM

## 2019-05-06 DIAGNOSIS — F22 Delusional disorders: Principal | ICD-10-CM

## 2019-05-06 MED ORDER — CYCLOBENZAPRINE 10 MG TABLET
ORAL_TABLET | Freq: Three times a day (TID) | ORAL | 0 refills | 10.00000 days | Status: CP | PRN
Start: 2019-05-06 — End: 2019-06-05

## 2019-05-06 MED ORDER — BUPRENORPHINE 8 MG-NALOXONE 2 MG SUBLINGUAL FILM
ORAL_FILM | Freq: Three times a day (TID) | SUBLINGUAL | 0 refills | 30 days | Status: CP
Start: 2019-05-06 — End: 2019-06-05

## 2019-05-06 MED ORDER — ARIPIPRAZOLE 10 MG TABLET
ORAL_TABLET | Freq: Every day | ORAL | 0 refills | 30 days | Status: CP
Start: 2019-05-06 — End: 2019-06-05

## 2019-06-03 ENCOUNTER — Encounter: Admit: 2019-06-03 | Discharge: 2019-06-04 | Payer: PRIVATE HEALTH INSURANCE

## 2019-06-03 DIAGNOSIS — B182 Chronic viral hepatitis C: Principal | ICD-10-CM

## 2019-06-03 DIAGNOSIS — F1199 Opioid use, unspecified with unspecified opioid-induced disorder: Principal | ICD-10-CM

## 2019-06-03 DIAGNOSIS — F22 Delusional disorders: Principal | ICD-10-CM

## 2019-06-03 DIAGNOSIS — F1910 Other psychoactive substance abuse, uncomplicated: Principal | ICD-10-CM

## 2019-06-03 MED ORDER — BUPRENORPHINE 8 MG-NALOXONE 2 MG SUBLINGUAL FILM
ORAL_FILM | Freq: Three times a day (TID) | SUBLINGUAL | 0 refills | 30 days | Status: CP
Start: 2019-06-03 — End: 2019-07-03

## 2019-06-06 ENCOUNTER — Encounter: Admit: 2019-06-06 | Discharge: 2019-06-07 | Payer: PRIVATE HEALTH INSURANCE

## 2019-06-11 DIAGNOSIS — M5441 Lumbago with sciatica, right side: Principal | ICD-10-CM

## 2019-06-11 DIAGNOSIS — M5442 Lumbago with sciatica, left side: Principal | ICD-10-CM

## 2019-06-11 DIAGNOSIS — M48061 Spinal stenosis, lumbar region without neurogenic claudication: Principal | ICD-10-CM

## 2019-06-11 DIAGNOSIS — G8929 Other chronic pain: Principal | ICD-10-CM

## 2019-06-11 DIAGNOSIS — M5126 Other intervertebral disc displacement, lumbar region: Principal | ICD-10-CM

## 2019-07-01 ENCOUNTER — Encounter: Admit: 2019-07-01 | Discharge: 2019-07-02 | Payer: PRIVATE HEALTH INSURANCE

## 2019-07-01 DIAGNOSIS — M5441 Lumbago with sciatica, right side: Secondary | ICD-10-CM

## 2019-07-01 DIAGNOSIS — M5442 Lumbago with sciatica, left side: Principal | ICD-10-CM

## 2019-07-01 DIAGNOSIS — G8929 Other chronic pain: Principal | ICD-10-CM

## 2019-07-01 DIAGNOSIS — F1910 Other psychoactive substance abuse, uncomplicated: Principal | ICD-10-CM

## 2019-07-01 DIAGNOSIS — F1199 Opioid use, unspecified with unspecified opioid-induced disorder: Principal | ICD-10-CM

## 2019-07-01 MED ORDER — GABAPENTIN 300 MG CAPSULE
ORAL_CAPSULE | Freq: Every evening | ORAL | 2 refills | 30.00000 days | Status: CP
Start: 2019-07-01 — End: 2019-09-29

## 2019-07-01 MED ORDER — BUPRENORPHINE 8 MG-NALOXONE 2 MG SUBLINGUAL FILM
ORAL_FILM | Freq: Three times a day (TID) | SUBLINGUAL | 1 refills | 30 days | Status: CP
Start: 2019-07-01 — End: 2019-08-30

## 2019-08-26 DIAGNOSIS — F1910 Other psychoactive substance abuse, uncomplicated: Principal | ICD-10-CM

## 2019-08-26 DIAGNOSIS — F1199 Opioid use, unspecified with unspecified opioid-induced disorder: Principal | ICD-10-CM

## 2019-08-26 MED ORDER — BUPRENORPHINE 8 MG-NALOXONE 2 MG SUBLINGUAL FILM
ORAL_FILM | Freq: Three times a day (TID) | SUBLINGUAL | 0 refills | 30.00000 days | Status: CP
Start: 2019-08-26 — End: 2019-09-25

## 2019-08-29 ENCOUNTER — Other Ambulatory Visit: Payer: Self-pay

## 2019-08-29 ENCOUNTER — Encounter: Payer: Self-pay | Admitting: Emergency Medicine

## 2019-08-29 ENCOUNTER — Ambulatory Visit
Admission: EM | Admit: 2019-08-29 | Discharge: 2019-08-29 | Disposition: A | Discharge status: Home / Self Care | Payer: Medicaid Other | Attending: Family Medicine | Admitting: Family Medicine

## 2019-08-29 DIAGNOSIS — J301 Allergic rhinitis due to pollen: Secondary | ICD-10-CM | POA: Diagnosis not present

## 2019-08-29 HISTORY — DX: Other psychoactive substance abuse, in remission: F19.11

## 2019-08-29 MED ORDER — METHYLPREDNISOLONE 4 MG PO TBPK
ORAL_TABLET | ORAL | 0 refills | Status: DC
Start: 2019-08-29 — End: 2022-09-18

## 2019-08-29 MED ORDER — FLUTICASONE PROPIONATE 50 MCG/ACT NA SUSP: 2.0000 | Freq: Every day | NASAL | 2 refills | Status: AC

## 2019-08-29 NOTE — ED Triage Notes (Signed)
Patient in today c/o nasal congestion and headache x 2-3 days. Patient denies fever. Patient refuses covid test.

## 2019-08-29 NOTE — ED Provider Notes (Signed)
[facility]    CSN: [id number] Arrival date & time: 08/29/19  1749      History   Chief Complaint Chief Complaint  Patient presents with  . Nasal Congestion  . Headache    HPI Jared Trujillo is a 39 y.o. male who presents today for evaluation of nasal congestion and headache.  Patient states that the symptoms have been ongoing for the past 2-3 days.  He does report a history of seasonal allergies, he is not taking any medications for this.  He does report some facial pressure, reports a runny nose and scratchy throat.  He denies any cough, chest pain or shortness of breath.  He denies any fevers or chills at home.  No recent travel.  No vision changes or hearing changes.  Does report mild nausea.  The patient does smoke 1 pack/day.  HPI  Past Medical History:  Diagnosis Date  . History of drug abuse ([place])     There are no problems to display for this patient.   Past Surgical History:  Procedure Laterality Date  . MOUTH SURGERY    . WRIST FRACTURE SURGERY Left        Home Medications    Prior to Admission medications   Medication Sig Start Date End Date Taking? Authorizing Provider  buprenorphine-naloxone (SUBOXONE) 8-2 mg SUBL SL tablet Place 1 tablet under the tongue daily.   Yes [provider]  ARIPiprazole (ABILIFY) 10 MG tablet Take 10 mg by mouth daily.    [provider]  fluticasone (FLONASE) 50 MCG/ACT nasal spray Place 2 sprays into both nostrils daily. 08/29/19   Lattie Corns, PA-C  methocarbamol (ROBAXIN) 500 MG tablet Take 1 tablet (500 mg total) by mouth every 6 (six) hours as needed. 03/29/19   Johnn Hai, PA-C  methylPREDNISolone (MEDROL DOSEPAK) 4 MG TBPK tablet Take per package instructions. 08/29/19   Lattie Corns, PA-C  oxyCODONE (OXY IR/ROXICODONE) 5 MG immediate release tablet Take 1 tablet (5 mg total) by mouth every 6 (six) hours as needed for severe pain. 03/29/19   Johnn Hai, PA-C     Family History Family History  Problem Relation Age of Onset  . Congestive Heart Failure Mother   . COPD Mother   . Healthy Father     Social History Social History   Tobacco Use  . Smoking status: Current Every Day Smoker    Packs/day: 1.00    Years: 20.00    Pack years: 20.00    Types: Cigarettes  . Smokeless tobacco: Current User  Substance Use Topics  . Alcohol use: Not Currently  . Drug use: Not Currently    Types: IV    Comment: last heroin use 6 months ago     Allergies   Acetaminophen   Review of Systems Review of Systems  Constitutional: Negative for fatigue and fever.  HENT: Positive for congestion, rhinorrhea, sinus pressure and sore throat.   Respiratory: Negative for cough, chest tightness, shortness of breath and wheezing.   Cardiovascular: Negative for chest pain.  Gastrointestinal: Negative for nausea.  All other systems reviewed and are negative.  Physical Exam Triage Vital Signs ED Triage Vitals  Enc Vitals Group     BP 08/29/19 1807 133/77     Pulse Rate 08/29/19 1807 68     Resp 08/29/19 1807 18     Temp 08/29/19 1807 98.4 F (36.9 C)     Temp Source 08/29/19 1807 Oral  SpO2 08/29/19 1807 100 %     Weight 08/29/19 1807 150 lb (68 kg)     Height 08/29/19 1807 5\' 9"  (1.753 m)     Head Circumference --      Peak Flow --      Pain Score 08/29/19 1806 3     Pain Loc --      Pain Edu? --      Excl. in GC? --    No data found.  Updated Vital Signs BP 133/77 (BP Location: Left Arm)   Pulse 68   Temp 98.4 F (36.9 C) (Oral)   Resp 18   Ht 5\' 9"  (1.753 m)   Wt 150 lb (68 kg)   SpO2 100%   BMI 22.15 kg/m   Visual Acuity Right Eye Distance:   Left Eye Distance:   Bilateral Distance:    Right Eye Near:   Left Eye Near:    Bilateral Near:     Physical Exam Constitutional:      General: He is not in acute distress.    Appearance: He is well-developed. He is not ill-appearing.  HENT:     Right Ear: Hearing and  tympanic membrane normal.     Left Ear: Hearing and tympanic membrane normal.     Nose:     Right Sinus: No maxillary sinus tenderness or frontal sinus tenderness.     Left Sinus: No maxillary sinus tenderness or frontal sinus tenderness.     Comments: Moderate erythema to bilateral nasal canals.    Mouth/Throat:     Comments: Mild erythema to posterior oropharynx.  Dentures intact to the roof of his mouth.  No exudate is noted. Cardiovascular:     Rate and Rhythm: Normal rate and regular rhythm.     Heart sounds: No murmur. No friction rub. No gallop.   Pulmonary:     Effort: Pulmonary effort is normal.     Breath sounds: Normal breath sounds. No stridor. No wheezing or rhonchi.  Lymphadenopathy:     Cervical: No cervical adenopathy.  Neurological:     Mental Status: He is alert.    UC Treatments / Results  Labs (all labs ordered are listed, but only abnormal results are displayed) Labs Reviewed - No data to display  EKG   Radiology No results found.  Procedures Procedures (including critical care time)  Medications Ordered in UC Medications - No data to display  Initial Impression / Assessment and Plan / UC Course  I have reviewed the triage vital signs and the nursing notes.  Pertinent labs & imaging results that were available during my care of the patient were reviewed by me and considered in my medical decision making (see chart for details).     1.  Treatment options were discussed today with the patient. 2.  I believe the patient's symptoms are related to underlying allergies. 3.  He was instructed to begin taking over-the-counter Zyrtec on a routine basis. 4.  The patient was prescribed Flonase and was given a Medrol Dosepak for symptoms. 5.  Follow-up if symptoms fail to improve.  The patient was given a work note following today's visit as well. Final Clinical Impressions(s) / UC Diagnoses   Final diagnoses:  Seasonal allergic rhinitis due to pollen      Discharge Instructions     -Use Flonase as prescribed. -Medrol as prescribed. -OTC Zyrtec on a routine basis. -Follow-up if no improvement.   ED Prescriptions    Medication Sig Dispense  Auth. Provider   methylPREDNISolone (MEDROL DOSEPAK) 4 MG TBPK tablet Take per package instructions. 21 tablet Blanchard Mane Lance, PA-C   fluticasone [facility]) 50 MCG/ACT nasal spray Place 2 sprays into both nostrils daily. 15.8 mL Anson Oregon, PA-C     PDMP not reviewed this encounter.   Anson Oregon, [place] 08/29/19 1921

## 2019-08-29 NOTE — Discharge Instructions (Addendum)
-  Use Flonase as prescribed. -Medrol as prescribed. -OTC Zyrtec on a routine basis. -Follow-up if no improvement.

## 2019-09-25 MED ORDER — BUPRENORPHINE 8 MG-NALOXONE 2 MG SUBLINGUAL FILM
ORAL_FILM | Freq: Three times a day (TID) | SUBLINGUAL | 0 refills | 7.00000 days | Status: CP
Start: 2019-09-25 — End: 2019-10-02

## 2019-09-30 ENCOUNTER — Ambulatory Visit: Admit: 2019-09-30 | Discharge: 2019-10-01 | Payer: PRIVATE HEALTH INSURANCE

## 2019-09-30 DIAGNOSIS — F1199 Opioid use, unspecified with unspecified opioid-induced disorder: Principal | ICD-10-CM

## 2019-09-30 DIAGNOSIS — G8929 Other chronic pain: Principal | ICD-10-CM

## 2019-09-30 DIAGNOSIS — F1910 Other psychoactive substance abuse, uncomplicated: Principal | ICD-10-CM

## 2019-09-30 DIAGNOSIS — M5441 Lumbago with sciatica, right side: Principal | ICD-10-CM

## 2019-09-30 DIAGNOSIS — M5442 Lumbago with sciatica, left side: Principal | ICD-10-CM

## 2019-09-30 MED ORDER — BUPRENORPHINE 8 MG-NALOXONE 2 MG SUBLINGUAL FILM
ORAL_FILM | Freq: Three times a day (TID) | SUBLINGUAL | 0 refills | 30 days | Status: CP
Start: 2019-09-30 — End: 2019-10-30

## 2019-09-30 MED ORDER — GABAPENTIN 300 MG CAPSULE
ORAL_CAPSULE | Freq: Every evening | ORAL | 2 refills | 30 days | Status: CP
Start: 2019-09-30 — End: 2019-12-29

## 2019-10-27 DIAGNOSIS — F1910 Other psychoactive substance abuse, uncomplicated: Principal | ICD-10-CM

## 2019-10-27 DIAGNOSIS — F1199 Opioid use, unspecified with unspecified opioid-induced disorder: Principal | ICD-10-CM

## 2019-10-27 MED ORDER — BUPRENORPHINE 8 MG-NALOXONE 2 MG SUBLINGUAL FILM
ORAL_FILM | Freq: Three times a day (TID) | SUBLINGUAL | 0 refills | 30.00000 days | Status: CP
Start: 2019-10-27 — End: 2019-11-26

## 2019-11-21 ENCOUNTER — Ambulatory Visit: Admit: 2019-11-21 | Discharge: 2019-11-22 | Payer: PRIVATE HEALTH INSURANCE

## 2019-11-21 DIAGNOSIS — R110 Nausea: Principal | ICD-10-CM

## 2019-11-21 DIAGNOSIS — F1910 Other psychoactive substance abuse, uncomplicated: Principal | ICD-10-CM

## 2019-11-21 DIAGNOSIS — M5442 Lumbago with sciatica, left side: Principal | ICD-10-CM

## 2019-11-21 DIAGNOSIS — G8929 Other chronic pain: Principal | ICD-10-CM

## 2019-11-21 DIAGNOSIS — F1199 Opioid use, unspecified with unspecified opioid-induced disorder: Principal | ICD-10-CM

## 2019-11-21 DIAGNOSIS — M5441 Lumbago with sciatica, right side: Secondary | ICD-10-CM

## 2019-11-21 DIAGNOSIS — K5903 Drug induced constipation: Principal | ICD-10-CM

## 2019-11-21 MED ORDER — GABAPENTIN 300 MG CAPSULE
ORAL_CAPSULE | Freq: Every evening | ORAL | 2 refills | 30 days | Status: CP
Start: 2019-11-21 — End: 2020-02-19

## 2019-11-21 MED ORDER — BUPRENORPHINE 8 MG-NALOXONE 2 MG SUBLINGUAL FILM
ORAL_FILM | Freq: Three times a day (TID) | SUBLINGUAL | 1 refills | 30 days | Status: CP
Start: 2019-11-21 — End: 2020-01-20

## 2019-11-21 MED ORDER — DOCUSATE SODIUM 100 MG CAPSULE
ORAL_CAPSULE | Freq: Every day | ORAL | 3 refills | 90.00000 days | Status: CP
Start: 2019-11-21 — End: 2020-11-20

## 2020-01-19 ENCOUNTER — Ambulatory Visit: Admit: 2020-01-19 | Discharge: 2020-01-20 | Payer: PRIVATE HEALTH INSURANCE

## 2020-01-19 DIAGNOSIS — F1910 Other psychoactive substance abuse, uncomplicated: Principal | ICD-10-CM

## 2020-01-19 DIAGNOSIS — M5441 Lumbago with sciatica, right side: Secondary | ICD-10-CM

## 2020-01-19 DIAGNOSIS — F1199 Opioid use, unspecified with unspecified opioid-induced disorder: Principal | ICD-10-CM

## 2020-01-19 DIAGNOSIS — Z23 Encounter for immunization: Principal | ICD-10-CM

## 2020-01-19 DIAGNOSIS — R12 Heartburn: Principal | ICD-10-CM

## 2020-01-19 DIAGNOSIS — M5442 Lumbago with sciatica, left side: Principal | ICD-10-CM

## 2020-01-19 DIAGNOSIS — G8929 Other chronic pain: Principal | ICD-10-CM

## 2020-01-19 DIAGNOSIS — F5104 Psychophysiologic insomnia: Principal | ICD-10-CM

## 2020-01-19 MED ORDER — OMEPRAZOLE 20 MG CAPSULE,DELAYED RELEASE
ORAL_CAPSULE | Freq: Every evening | ORAL | 0 refills | 14.00000 days | Status: CP
Start: 2020-01-19 — End: 2020-02-02

## 2020-01-19 MED ORDER — BUPRENORPHINE 8 MG-NALOXONE 2 MG SUBLINGUAL FILM
ORAL_FILM | Freq: Three times a day (TID) | SUBLINGUAL | 1 refills | 30.00000 days | Status: CP
Start: 2020-01-19 — End: 2020-01-19

## 2020-01-19 MED ORDER — HYDROXYZINE PAMOATE 25 MG CAPSULE
ORAL_CAPSULE | Freq: Every evening | ORAL | 1 refills | 30 days | Status: CP | PRN
Start: 2020-01-19 — End: 2020-04-18

## 2020-01-19 MED ORDER — SUBOXONE 8 MG-2 MG SUBLINGUAL FILM
ORAL_FILM | Freq: Three times a day (TID) | SUBLINGUAL | 1 refills | 30.00000 days | Status: CP
Start: 2020-01-19 — End: 2020-03-19

## 2020-01-19 MED ORDER — GABAPENTIN 300 MG CAPSULE
ORAL_CAPSULE | ORAL | 2 refills | 0.00000 days | Status: CP
Start: 2020-01-19 — End: ?

## 2020-02-13 ENCOUNTER — Encounter: Payer: Self-pay | Admitting: Emergency Medicine

## 2020-02-13 ENCOUNTER — Other Ambulatory Visit: Payer: Self-pay

## 2020-02-13 DIAGNOSIS — F1123 Opioid dependence with withdrawal: Secondary | ICD-10-CM | POA: Insufficient documentation

## 2020-02-13 DIAGNOSIS — F17210 Nicotine dependence, cigarettes, uncomplicated: Secondary | ICD-10-CM | POA: Diagnosis not present

## 2020-02-13 NOTE — ED Triage Notes (Signed)
Pt presents via POV with c/o concerns he is withdrawing. Pt takes suboxone, but recently relapsed on heroin last Tuesday. Pt reports this was his last use. Pt denies SI/HI. Pt denies auditory or visual hallucinations.

## 2020-02-14 ENCOUNTER — Emergency Department
Admission: EM | Admit: 2020-02-14 | Discharge: 2020-02-14 | Disposition: A | Discharge status: Home / Self Care | Payer: Medicaid Other | Attending: Emergency Medicine | Admitting: Emergency Medicine

## 2020-02-14 DIAGNOSIS — F1123 Opioid dependence with withdrawal: Secondary | ICD-10-CM

## 2020-02-14 DIAGNOSIS — F1193 Opioid use, unspecified with withdrawal: Secondary | ICD-10-CM

## 2020-02-14 MED ORDER — BUPRENORPHINE HCL-NALOXONE HCL 8-2 MG SL SUBL
2.0000 | SUBLINGUAL_TABLET | Freq: Every day | SUBLINGUAL | Status: DC
  Administered 2020-02-14: 2 via SUBLINGUAL
  Filled 2020-02-14: qty 2

## 2020-02-14 MED ORDER — DICYCLOMINE HCL 10 MG PO CAPS: 10.0000 mg | ORAL_CAPSULE | Freq: Three times a day (TID) | ORAL | 0 refills | Status: AC | PRN

## 2020-02-14 MED ORDER — ONDANSETRON 4 MG PO TBDP: 4.0000 mg | ORAL_TABLET | Freq: Three times a day (TID) | ORAL | 0 refills | Status: DC | PRN

## 2020-02-14 MED ORDER — CLONIDINE HCL 0.1 MG PO TABS
0.1000 mg | ORAL_TABLET | Freq: Once | ORAL | Status: AC
  Administered 2020-02-14: 0.1 mg via ORAL
  Filled 2020-02-14: qty 1

## 2020-02-14 MED ORDER — DICYCLOMINE HCL 10 MG PO CAPS
10.0000 mg | ORAL_CAPSULE | Freq: Once | ORAL | Status: AC
  Administered 2020-02-14: 10 mg via ORAL
  Filled 2020-02-14: qty 1

## 2020-02-14 MED ORDER — BUPRENORPHINE HCL-NALOXONE HCL 8-2 MG SL SUBL: 2.0000 | SUBLINGUAL_TABLET | Freq: Every day | SUBLINGUAL | Status: DC

## 2020-02-14 NOTE — ED Notes (Signed)
Pt requested a snack, given sandwich tray and ginger ale.

## 2020-02-14 NOTE — ED Provider Notes (Signed)
[facility] Emergency Department Provider Note _______   First MD Initiated Contact with Patient 02/14/20 0138     (approximate)  I have reviewed the triage vital signs and the nursing notes.   HISTORY  Chief Complaint Withdrawal    HPI Jared Trujillo is a 39 y.o. male with history of opioid use disorder currently being treated with Suboxone by Dr. Merilynn Finland presents to the emergency department stating that he had a relapse last week when he used IV heroin with last use on Tuesday.  Patient states that he currently believes that he is having symptoms of withdrawal.  Patient states that he started taking Suboxone again however symptoms have persisted.  Patient states that he had Suboxone in his wallet when he was pulled over by the police and he was charged with possession because they were in his wallet and they were confiscated.  As such patient states that he will be without his Suboxone until Wednesday when he can fill his prescription.  Patient admits to abdominal pain and nausea       Past Medical History:  Diagnosis Date   History of drug abuse ([facility])     There are no problems to display for this patient.   Past Surgical History:  Procedure Laterality Date   MOUTH SURGERY     WRIST FRACTURE SURGERY Left     Prior to Admission medications   Medication Sig Start Date End Date Taking? Authorizing Provider  ARIPiprazole (ABILIFY) 10 MG tablet Take 10 mg by mouth daily.    [provider]  buprenorphine-naloxone (SUBOXONE) 8-2 mg SUBL SL tablet Place 1 tablet under the tongue daily.    [provider]  fluticasone (FLONASE) 50 MCG/ACT nasal spray Place 2 sprays into both nostrils daily. 08/29/19   Anson Oregon, PA-C  methocarbamol (ROBAXIN) 500 MG tablet Take 1 tablet (500 mg total) by mouth every 6 (six) hours as needed. 03/29/19   Tommi Rumps, PA-C  methylPREDNISolone (MEDROL DOSEPAK) 4 MG TBPK tablet Take per  package instructions. 08/29/19   Anson Oregon, PA-C  oxyCODONE (OXY IR/ROXICODONE) 5 MG immediate release tablet Take 1 tablet (5 mg total) by mouth every 6 (six) hours as needed for severe pain. 03/29/19   Tommi Rumps, PA-C    Allergies Acetaminophen  Family History  Problem Relation Age of Onset   Congestive Heart Failure Mother    COPD Mother    Healthy Father     Social History Social History   Tobacco Use   Smoking status: Current Every Day Smoker    Packs/day: 1.00    Years: 20.00    Pack years: 20.00    Types: Cigarettes   Smokeless tobacco: Current User  Vaping Use   Vaping Use: Every day   Substances: Nicotine  Substance Use Topics   Alcohol use: Not Currently   Drug use: Not Currently    Types: IV    Comment: last heroin use 6 months ago    Review of Systems Constitutional: No fever/chills Eyes: No visual changes. ENT: No sore throat. Cardiovascular: Denies chest pain. Respiratory: Denies shortness of breath. Gastrointestinal: Positive for abdominal cramping and nausea   No diarrhea.  No constipation. Genitourinary: Negative for dysuria. Musculoskeletal: Negative for neck pain.  Negative for back pain. Integumentary: Negative for rash. Neurological: Negative for headaches, focal weakness or numbness.   ____________________________________________   PHYSICAL EXAM:  VITAL SIGNS: ED Triage Vitals  Enc Vitals Group  BP 02/13/20 1846 114/76     Pulse Rate 02/13/20 1846 (!) 105     Resp 02/13/20 1846 17     Temp 02/13/20 1846 99 F (37.2 C)     Temp Source 02/13/20 1846 Oral     SpO2 02/13/20 1846 100 %     Weight 02/13/20 1847 65.8 kg (145 lb)     Height 02/13/20 1847 1.753 m (5\' 9" )     Head Circumference --      Peak Flow --      Pain Score 02/13/20 1847 7     Pain Loc --      Pain Edu? --      Excl. in GC? --     Constitutional: Alert and oriented.  Eyes: Conjunctivae are normal.  Mouth/Throat: Patient is wearing  a mask. Neck: No stridor.  No meningeal signs.   Cardiovascular: Normal rate, regular rhythm. Good peripheral circulation. Grossly normal heart sounds. Respiratory: Normal respiratory effort.  No retractions. Gastrointestinal: Soft and nontender. No distention.  Musculoskeletal: No lower extremity tenderness nor edema. No gross deformities of extremities. Neurologic:  Normal speech and language. No gross focal neurologic deficits are appreciated.  Skin:  Skin is warm, dry and intact. Psychiatric: Mood and affect are normal. Speech and behavior are normal.    Procedures   ____________________________________________   INITIAL IMPRESSION / MDM / ASSESSMENT AND PLAN / ED COURSE  As part of my medical decision making, I reviewed the following data within the electronic MEDICAL RECORD NUMBER  39 year old male presented with above-stated history and physical exam concerning for opiate withdrawal.  Patient given Suboxone, clonidine and Bentyl in the emergency department and advised to follow-up with Dr. 20 today.  Patient was able to eat and tolerate it while in the ED ____________________________________________  FINAL CLINICAL IMPRESSION(S) / ED DIAGNOSES  Final diagnoses:  Opioid withdrawal (HCC)     MEDICATIONS GIVEN DURING THIS VISIT:  Medications  buprenorphine-naloxone (SUBOXONE) 8-2 mg per SL tablet 2 tablet (2 tablets Sublingual Not Given 02/14/20 0238)  cloNIDine (CATAPRES) tablet 0.1 mg (0.1 mg Oral Given 02/14/20 0221)  dicyclomine (BENTYL) capsule 10 mg (10 mg Oral Given 02/14/20 0221)     ED Discharge Orders    None      *Please note:  [name] was evaluated in Emergency Department on 02/14/2020 for the symptoms described in the history of present illness. He was evaluated in the context of the global COVID-19 pandemic, which necessitated consideration that the patient might be at risk for infection with the SARS-CoV-2 virus that causes COVID-19.  Institutional protocols and algorithms that pertain to the evaluation of patients at risk for COVID-19 are in a state of rapid change based on information released by regulatory bodies including the CDC and federal and state organizations. These policies and algorithms were followed during the patient's care in the ED.  Some ED evaluations and interventions may be delayed as a result of limited staffing during and after the pandemic.*  Note:  This document was prepared using Dragon voice recognition software and may include unintentional dictation errors.   02/16/2020, MD 02/14/20 [phone]

## 2020-02-14 NOTE — ED Notes (Signed)
Resumed care from [place], [place]. Pt resting comfortably, waiting on ride to arrive.

## 2020-02-14 NOTE — ED Notes (Signed)
Hourly rounding completed at this time, patient currently asleep in hallway bed. No complaints, stable, and in no acute distress. Q15 minute rounds and monitoring via Rover and Officer to continue. 

## 2020-02-14 NOTE — ED Notes (Signed)
Pt discharged home after verbalizing understanding of discharge instructions; nad noted. 

## 2020-02-14 NOTE — ED Notes (Signed)
Hourly rounding completed at this time, patient currently awake in hallway bed. No complaints, stable, and in no acute distress. Q15 minute rounds and monitoring via Rover and Officer to continue. 

## 2020-02-14 NOTE — ED Notes (Signed)
Pt reports that he has history of heroin addiction that he has stopped and since been taking suboxone. Pt states that last week he used heroin again and used for a week, stopped Tuesday and has been taking his suboxone again but does not feel well. When asked how he feels, pt states "horrible." Pt states he has abdominal pain that he is unclear if it is from withdrawal or being hungry, and reports lack of sleep over same time. Pt states he is concerned due to being charged Tuesday with having suboxone that was not labeled on his person but states that he feels he can get out of the charges and then immediately states, "I ain't suicidal or nothing like that, you can see I have a full time job and stuff, I just don't feel good."

## 2020-02-14 NOTE — ED Notes (Signed)
Patient transferred from Triage to room 19H. Report received from Lisa, RN including situation, background, assessment and recommendations. Pt oriented to Quad including Q15 minute rounds as well as Rover and Officer for their protection. Patient is alert and oriented, warm and dry in no acute distress. Patient denies SI, HI, and AVH. Pt. Encouraged to let this nurse know if needs arise. 

## 2020-02-14 NOTE — ED Notes (Signed)
Pt states that his ride is on the way after being able to reach him, states he will be here in the hour. Pt requests and provided with a ginger ale, pt reports it helps with his stomach pain

## 2020-02-14 NOTE — ED Notes (Signed)
Dr. Manson Passey asks pt to call ride, pt reports to this nurse that he was unable to reach his ride. States that his ride typically wakes up for work at [employer] and will try again at that time. MD notified.

## 2020-03-05 ENCOUNTER — Ambulatory Visit: Admit: 2020-03-05 | Discharge: 2020-03-06 | Payer: PRIVATE HEALTH INSURANCE

## 2020-03-05 DIAGNOSIS — F5104 Psychophysiologic insomnia: Principal | ICD-10-CM

## 2020-03-05 DIAGNOSIS — F1190 Opioid use, unspecified, uncomplicated: Principal | ICD-10-CM

## 2020-03-05 DIAGNOSIS — B182 Chronic viral hepatitis C: Principal | ICD-10-CM

## 2020-03-05 MED ORDER — HYDROXYZINE PAMOATE 25 MG CAPSULE
ORAL_CAPSULE | Freq: Every evening | ORAL | 5 refills | 30.00000 days | Status: CP | PRN
Start: 2020-03-05 — End: 2020-05-07

## 2020-03-10 DIAGNOSIS — F1190 Opioid use, unspecified, uncomplicated: Principal | ICD-10-CM

## 2020-03-15 MED ORDER — SUBOXONE 8 MG-2 MG SUBLINGUAL FILM
ORAL_FILM | Freq: Three times a day (TID) | SUBLINGUAL | 0 refills | 30.00000 days | Status: CP
Start: 2020-03-15 — End: 2020-03-10

## 2020-04-06 DIAGNOSIS — T402X5A Adverse effect of other opioids, initial encounter: Principal | ICD-10-CM

## 2020-04-06 DIAGNOSIS — K5903 Drug induced constipation: Principal | ICD-10-CM

## 2020-04-06 MED ORDER — LACTULOSE 10 GRAM/15 ML (15 ML) ORAL SOLUTION
Freq: Two times a day (BID) | ORAL | 0 refills | 15 days | Status: CP
Start: 2020-04-06 — End: ?

## 2020-04-07 DIAGNOSIS — M5441 Lumbago with sciatica, right side: Principal | ICD-10-CM

## 2020-04-07 DIAGNOSIS — G8929 Other chronic pain: Principal | ICD-10-CM

## 2020-04-07 DIAGNOSIS — M5442 Lumbago with sciatica, left side: Principal | ICD-10-CM

## 2020-04-08 DIAGNOSIS — M5441 Lumbago with sciatica, right side: Principal | ICD-10-CM

## 2020-04-08 DIAGNOSIS — M5442 Lumbago with sciatica, left side: Principal | ICD-10-CM

## 2020-04-08 DIAGNOSIS — G8929 Other chronic pain: Principal | ICD-10-CM

## 2020-04-08 DIAGNOSIS — B182 Chronic viral hepatitis C: Principal | ICD-10-CM

## 2020-04-08 MED ORDER — GABAPENTIN 300 MG CAPSULE
ORAL_CAPSULE | ORAL | 2 refills | 0.00000 days | Status: CP
Start: 2020-04-08 — End: 2020-06-16

## 2020-04-09 MED ORDER — GABAPENTIN 300 MG CAPSULE
ORAL_CAPSULE | 0 refills | 0 days
Start: 2020-04-09 — End: ?

## 2020-04-14 DIAGNOSIS — B182 Chronic viral hepatitis C: Principal | ICD-10-CM

## 2020-04-14 MED ORDER — SUBOXONE 8 MG-2 MG SUBLINGUAL FILM
ORAL_FILM | Freq: Three times a day (TID) | SUBLINGUAL | 0 refills | 30.00000 days | Status: CP
Start: 2020-04-14 — End: 2020-05-04

## 2020-05-04 DIAGNOSIS — F1190 Opioid use, unspecified, uncomplicated: Principal | ICD-10-CM

## 2020-05-07 DIAGNOSIS — G8929 Other chronic pain: Principal | ICD-10-CM

## 2020-05-07 DIAGNOSIS — F5104 Psychophysiologic insomnia: Principal | ICD-10-CM

## 2020-05-07 DIAGNOSIS — M5441 Lumbago with sciatica, right side: Principal | ICD-10-CM

## 2020-05-07 DIAGNOSIS — M5442 Lumbago with sciatica, left side: Principal | ICD-10-CM

## 2020-05-07 MED ORDER — HYDROXYZINE PAMOATE 25 MG CAPSULE
ORAL_CAPSULE | Freq: Every evening | ORAL | 5 refills | 30.00000 days | Status: CP | PRN
Start: 2020-05-07 — End: 2020-06-18

## 2020-05-11 MED ORDER — SUBOXONE 8 MG-2 MG SUBLINGUAL FILM
ORAL_FILM | Freq: Three times a day (TID) | SUBLINGUAL | 0 refills | 30.00000 days | Status: CP
Start: 2020-05-11 — End: 2020-06-10

## 2020-05-12 MED ORDER — SUBOXONE 8 MG-2 MG SUBLINGUAL FILM
ORAL_FILM | Freq: Three times a day (TID) | SUBLINGUAL | 0 refills | 16.00000 days | Status: CP
Start: 2020-05-12 — End: 2020-05-07

## 2020-05-16 ENCOUNTER — Ambulatory Visit
Admit: 2020-05-16 | Discharge: 2020-05-17 | Payer: PRIVATE HEALTH INSURANCE | Attending: Gastroenterology | Primary: Gastroenterology

## 2020-05-16 DIAGNOSIS — Z886 Allergy status to analgesic agent status: Principal | ICD-10-CM

## 2020-05-16 DIAGNOSIS — Z87891 Personal history of nicotine dependence: Principal | ICD-10-CM

## 2020-05-16 DIAGNOSIS — B182 Chronic viral hepatitis C: Principal | ICD-10-CM

## 2020-05-23 DIAGNOSIS — B182 Chronic viral hepatitis C: Principal | ICD-10-CM

## 2020-05-23 MED ORDER — GLECAPREVIR 100 MG-PIBRENTASVIR 40 MG TABLET
Freq: Every day | ORAL | 1 refills | 28 days | Status: CP
Start: 2020-05-23 — End: ?
  Filled 2020-05-29: qty 1, 28d supply, fill #0

## 2020-05-28 NOTE — Unmapped (Signed)
[facility]   Patient Onboarding/Medication Counseling    [name] is a 40 y.o. male with Hepatitis C who I am counseling today on initiation of therapy.  I am speaking to the patient.    Was a Nurse, learning disability used for this call? No    Verified patient's date of birth / HIPAA.    Specialty medication(s) to be sent: Infectious Disease: Mavyret      Non-specialty medications/supplies to be sent: n/a      Medications not needed at this time: n/a         Mavyret (glecaprevir and pibrentasvir) 100mg /40mg     Planned Start Date: 05/29/20    Has the patient been told to call and make a 4 week follow-up appointment after their medicine has been received? Yes, he was told to call [phone] option 1, option 1      Medication & Administration     Dosage: Take three tablets by mouth once daily for 8 weeks.    Administration: Take with food.    Adherence/Missed dose instructions:   ??? Take missed dose with food as soon as you think about it.   ??? If it has been more than 18 hours since the missed dose, skip the missed dose and resume with your next scheduled dose.  ??? Do not take extra doses or 2 doses at the same time.    Goals of Therapy     The goal of therapy is to cure the patient of Hepatitis C. Patients who have undetectable HCV RNA in the serum, as assessed by a sensitive polymerase chain reaction (PCR) assay, ?12 weeks after treatment completion are deemed to have achieved SVR (cure). (www.hcvguidelines.org)     Side Effects & Monitoring Parameters     Common Side Effects:  ??? Headache  ??? Fatigue   ??? Nausea    The following side effects should be reported to the provider:  ??? Signs of liver problems like dark urine, feeling tired, lack of hunger, upset stomach or stomach pain, light-colored stools, throwing up, or yellow skin/eyes.  ??? Signs of an allergic reaction, such as rash; hives; itching; red, swollen, blistered, or peeling skin with or without fever.   ??? If you have wheezing; tightness in the chest or throat; trouble breathing, swallowing, or talking; unusual hoarseness; or swelling of the mouth, face, lips, tongue, or throat, call 911 or go to the closest emergency department (ED).     Monitoring Parameters: (AASLD-IDSA Guidelines, 2019)    Within 6 months prior to starting treatment:  ??? Complete blood count (CBC).  ??? International normalized ratio (INR) if clinically indicated.   ??? Hepatic function panel: albumin, total and direct bilirubin, ALT, AST, and Alkaline Phosphatase levels.  ??? Calculated glomerular filtration rate (eGFR).  ??? Pregnancy testing within 1 month prior to starting treatment in females of childbearing age  Any time prior to starting treatment:  ??? Test for HCV genotype.  ??? Assess for hepatic fibrosis.  ??? Quantitative HCV RNA (HCV viral load).  ??? Assess for active HBV coinfection: HBV surface antigen (HBsAg); If HBsAg positive, then should be assessed for whether HBV DNA level meets AASLD criteria for HBV treatment.  ??? Assess for evidence of prior HBV infection and immunity: HBV core antibody (anti-HBc) and HBV surface antibody (anti-HBS).   ??? Assess for HIV coinfection.    Monitoring during treatment:   ??? Diabetics should monitor for signs of hypoglycemia.  ??? Patients on warfarin should monitor for  changes in INR.  ??? Pregnancy test monthly in females of childbearing age.  ??? For HBsAg positive patients not already on HBV therapy because baseline HBV DNA level does not meet treatment criteria:   o Initiate prophylactic HBV antiviral therapy OR  o Monitor HBV DNA levels monthly during and immediately after Mavyret therapy.    After treatment to document a cure:   ??? Quantitative HCV viral load 12 weeks after completion of therapy.    Contraindications, Warnings, & Precautions     Contraindications: Moderate or severe hepatic impairment (Child-Pugh class B or C); history of hepatic decompensation; coadministration with atazanavir or rifampin.    Disease-related concerns:   o Hepatitis B virus reactivation.   o Risk of hepatic decompensation/failure in patients with evidence of advanced liver disease.    Drug/Food Interactions     ??? Medication list reviewed in Epic. The patient was instructed to inform the care team before taking any new medications or supplements. No drug interactions identified.   ??? Encourage minimizing alcohol intake.  ??? HMG-CoA Reductase Inhibitors: Use the lowest dose of statins possible. Monitor for signs of toxicity such as myopathy. Ask patient to self-report potential side effects of increased concentrations such as muscle pain.  o Avoid use with atorvastatin, simvastatin, or lovastatin.  o Limit rosuvastatin to a maximum of 10mg  daily.  o Reduce pravastatin dose by 50%.   ??? Potential interactions: Clearance of HCV infection with direct acting antivirals may lead to changes in hepatic function, which may impact the safe and effective use of concomitant medications. Frequent monitoring of relevant laboratory parameters (e.g., International Normalized Ratio [INR] in patients taking warfarin, blood glucose levels in diabetic patients) or drug concentrations of concomitant medications such as cytochrome P450 substrates with a narrow therapeutic index (e.g. certain immunosuppressants) is recommended to ensure safe and effective use. Dose adjustments of concomitant medications may be necessary.    Storage, Handling Precautions, & Disposal     ??? Store in the original container at room temperature.   ??? Store in a dry place. Do not store in a bathroom.   ??? Keep all drugs in a safe place. Keep all drugs out of the reach of children and pets.   ??? Disposal: You should NOT have any Mavyret to throw away.    Current Medications (including OTC/herbals), Comorbidities and Allergies     Current Outpatient Medications   Medication Sig Dispense Refill   ??? docusate sodium (COLACE) 100 MG capsule Take 1 capsule (100 mg total) by mouth daily. 90 capsule 3   ??? fluticasone propionate (FLONASE) 50 mcg/actuation nasal spray 2 sprays into each nostril.     ??? gabapentin (NEURONTIN) 300 MG capsule 1-2 tab po at 5pm for back pain/sciatica 60 capsule 2   ??? glecaprevir-pibrentasvir (MAVYRET) 100-40 mg tablet Take 3 tablets by mouth daily. Take with food. 1 4-week carton 1   ??? hydrOXYzine (VISTARIL) 25 MG capsule Take 1 capsule (25 mg total) by mouth nightly as needed (sleep). 30 capsule 5   ??? lactulose (CHRONULAC) 10 gram/15 mL (15 mL) Soln Take 30 mL (20 g total) by mouth Two (2) times a day. For constipation 900 mL 0   ??? SUBOXONE 8-2 mg sublingual film Place 1 Film (8 mg of buprenorphine total) under the tongue Three (3) times a day. 90 Film 0   ??? [START ON 06/10/2020] SUBOXONE 8-2 mg sublingual film Place 1 Film (8 mg of buprenorphine total) under the tongue Three (3) times a  day for 12 days. 36 Film 0     No current facility-administered medications for this visit.       Allergies   Allergen Reactions   ??? Acetaminophen Hives       Patient Active Problem List   Diagnosis   ??? Smoker   ??? Polysubstance abuse (CMS-HCC)   ??? Chronic insomnia   ??? Chronic hepatitis C without hepatic coma (CMS-HCC)   ??? Opioid use disorder   ??? Hypoglycemia   ??? Drug-induced constipation   ??? Abdominal pain   ??? Palpitations   ??? Contusion of left chest wall   ??? Cough   ??? Poor social situation   ??? Paranoia (CMS-HCC)   ??? Chronic bilateral low back pain with bilateral sciatica   ??? Risk for falls   ??? Suprapubic pain   ??? Nausea   ??? Heartburn       Reviewed and up to date in Epic.    Appropriateness of Therapy     Is medication and dose appropriate based on diagnosis? Yes    Prescription has been clinically reviewed: Yes    Baseline Quality of Life Assessment      How many days over the past month did your Hepatitis C  keep you from your normal activities? For example, brushing your teeth or getting up in the morning. 0    Financial Information     Medication Assistance provided: Prior Authorization    Anticipated copay of $0.00 reviewed with patient. Verified delivery address.    Delivery Information     Scheduled delivery date: 05/29/20    Expected start date: 05/29/20    Medication will be delivered via Same Day Courier to the prescription address in [facility].  This shipment will not require a signature.      Explained the services we provide at [facility] and that each month we would call to set up refills.  Stressed importance of returning phone calls so that we could ensure they receive their medications in time each month.  Informed patient that we should be setting up refills 7-10 days prior to when they will run out of medication.  A pharmacist will reach out to perform a clinical assessment periodically.  Informed patient that a welcome packet and a drug information handout will be sent.      Patient verbalized understanding of the above information as well as how to contact the pharmacy at [phone] option 4 with any questions/concerns.  The pharmacy is open Monday through Friday 8:30am-4:30pm.  A pharmacist is available 24/7 via pager to answer any clinical questions they may have.    Patient Specific Needs     - Does the patient have any physical, cognitive, or cultural barriers? No    - Patient prefers to have medications discussed with  Patient     - Is the patient or caregiver able to read and understand education materials at a high school level or above? Yes    - Patient's primary language is  English     - Is the patient high risk? No    - Does the patient require a Care Management Plan? No     - Does the patient require physician intervention or other additional services (i.e. nutrition, smoking cessation, social work)? No      Roderic Palau  [facility] Specialty Pharmacist

## 2020-05-28 NOTE — Unmapped (Signed)
[facility] Specialty Medication Onboarding    Specialty Medication: Mavyret 100-40 mg tablet  Prior Authorization: Approved   Financial Assistance: No - copay  <$25  Final Copay/Day Supply: $0.00 / 28 day supply    Insurance Restrictions: None     Notes to Pharmacist:     The triage team has completed the benefits investigation and has determined that the patient is able to fill this medication at [facility]. Please contact the patient to complete the onboarding or follow up with the prescribing physician as needed.

## 2020-06-07 NOTE — Unmapped (Signed)
Assessment & Plan        I personally spent 20 minutes face-to-face and non-face-to-face in the care of this patient, which includes all pre, intra, and post visit time on the date of service.       Problem List Items Addressed This Visit     Chronic bilateral low back pain with bilateral sciatica     Stable/well controlled on gabapentin 300 mg 1-2 tabs qPM -MRI L-spine on 06/06/2019 showed herniated discs at L4-L5 and L5-S1.????  ?? Continue 1 or 2 tabs of gabapentin 300 mg nightly as needed. Previously emphasized that patient should take no more than 1 tab gabapentin earlier in the evening (~5 pm) if he plans to take vistaril for sleep that night as gabapentin, Suboxone, and Vistaril all have sedative effects which can be dangerous if combined.  ??            Relevant Medications    gabapentin (NEURONTIN) 300 MG capsule    Chronic insomnia     Well controlled on current medication regimen  ?? Continue Vistaril 25 mg at bedtime as needed (taking most nights)  I have Strongly counseled patient to avoid taking too many sedative medications in a close time frame. Previously reviewed that on nights he plans to take Vistaril, he should take no more than 1 tab gabapentin 300 mg no later than 5 pm, Suboxone 1 film at 9 pm, then Vistaril 25 mg at bedtime??11pm.??         Relevant Medications    hydrOXYzine (VISTARIL) 25 MG capsule    Opioid use disorder - Primary     Hx of opioid pill, cocaine, heroin and marijuana use.   Overall had been stable on Suboxone??last??57mos; seen in ED 02/14/20 after a heroin relapse (patient claims that was a story he made up to obtain Suboxone after his supply was taken)  ?? Last 7 UDSs were approp  ?? HIV neg 8/20. Known +hep C, currently being tx'd w/Mavyret by [facility]  ?? He has narcan nasal sprays at home provided by pharmacy for opioid overdose prevention  ?? Continue buprenorphine-naloxone 8-2mg  1 film TID as prescribed, 30d worth w/1 rf as now seeing pt q46mos  ?? Controlled substance contract signed??09/30/2019  ?? UDS collected today, 06/18/20  ?? PDMP today showed he last filled??30 day supply of??suboxone??05/07/20, 12 day supply 06/03/20. No inappropriate entries.  ?? Plan f/u in 2 months         Relevant Medications    SUBOXONE 8-2 mg sublingual film    Other Relevant Orders    Benzodiazepines, Conf, Urine    Opiate Confirmation, Urine    Toxicology Screen, Urine    Buprenorphine and Metabolite, Urine    Polysubstance abuse (CMS-HCC)     See tab for OUD           Other Visit Diagnoses     Flu vaccine need               Return for f/u Suboxone w/UDS (within 60 days).     Medication adherence and barriers to the treatment plan have been addressed. Opportunities to optimize healthy behaviors have been discussed. Patient / caregiver voiced understanding.      Subjective:     Harold Fitzpatrick is a 40 y.o. male who presents for Follow-up            History of Present Illness     Patient last seen by me 03/05/20. Chronic HEP C:  planning to update labs and refer to hepatology clinic when patient is able to take off work. Chronic insomnia: well controlled on Vistaril 25 mg at bedtime. Counseled to avoid taking too many sedative medications. OUD: continue Suboxone 1 film three times daily.     Seen at the liver center 05/16/20; patient will meet with pharmacist to review treatment options. Urged patient to abstain from EtOH. Start Mavyret 3 tabs daily x8 weeks.     Patient denies any issues with hepatitis medication and endorses excellent improvement in energy levels. His constipation seems to have improved/resolved as well. No concerns about Suboxone. Still taking gabapentin for LBP, which helps with sleep. Patient has been vaccinated against COVID-19 and will send dates via MyChart.     refuses flu shot    Taking med regularly- yes  Symptoms of withdrawal- no  Symptoms of opioid cravings- no  Medication side effects- no  Attending counseling- no  Recent relapses- no  Other illicit substance use- no       No results found for: A1C  No results found for: LDL  BP Readings from Last 3 Encounters:   06/18/20 116/78   05/16/20 116/72   03/05/20 109/71     Wt Readings from Last 3 Encounters:   06/18/20 67.1 kg (148 lb)   05/16/20 67.1 kg (148 lb)   03/05/20 64 kg (141 lb 1.3 oz)           Review of Systems     History obtained from the patient and chart review     ROS negative unless otherwise stated in the HPI:            Review of History     MEDICATIONS:        Current Outpatient Medications on File Prior to Visit   Medication Sig Dispense Refill   ??? docusate sodium (COLACE) 100 MG capsule Take 1 capsule (100 mg total) by mouth daily. 90 capsule 3   ??? fluticasone propionate (FLONASE) 50 mcg/actuation nasal spray 2 sprays into each nostril.     ??? glecaprevir-pibrentasvir (MAVYRET) 100-40 mg tablet Take 3 tablets by mouth daily. Take with food. 1 4-week carton 1   ??? lactulose (CHRONULAC) 10 gram/15 mL (15 mL) Soln Take 30 mL (20 g total) by mouth Two (2) times a day. For constipation 900 mL 0   ??? SUBOXONE 8-2 mg sublingual film PLACE 1 STRIP UNDER THE TONGUE THREE TIMES DAILY 12 each 0     No current facility-administered medications on file prior to visit.        ALLERGIES:      Acetaminophen         PAST MEDICAL HISTORY:     Past Medical History:   Diagnosis Date   ??? Substance abuse (CMS-HCC)        PAST SURGICAL HISTORY:     Past Surgical History:   Procedure Laterality Date   ??? DENTAL SURGERY     ??? left arm surgery      ~2008       PROBLEM LIST:     Patient Active Problem List    Diagnosis Date Noted   ??? Heartburn 01/19/2020   ??? Nausea 11/21/2019   ??? Risk for falls 03/10/2019   ??? Suprapubic pain 03/10/2019   ??? Chronic bilateral low back pain with bilateral sciatica 02/07/2019   ??? Paranoia (CMS-HCC) 12/16/2018   ??? Poor social situation 11/20/2018   ??? Cough 07/08/2018   ??? Palpitations 02/11/2018   ???  Contusion of left chest wall 02/11/2018   ??? Drug-induced constipation 01/15/2018   ??? Abdominal pain 01/15/2018   ??? Hypoglycemia 12/17/2017   ??? Opioid use disorder 10/21/2017   ??? Smoker 10/20/2017   ??? Polysubstance abuse (CMS-HCC) 10/20/2017   ??? Chronic insomnia 10/20/2017   ??? Chronic hepatitis C without hepatic coma (CMS-HCC) 10/20/2017           SOCIAL HISTORY:      reports that he quit smoking about 5 weeks ago. His smoking use included cigarettes. He smoked 1.00 pack per day. He uses smokeless tobacco. He reports previous drug use. He reports that he does not drink alcohol.   FAMILY HISTORY:     family history includes COPD in his maternal grandmother and mother; Congenital heart disease in his maternal grandfather, maternal grandmother, paternal grandfather, and paternal grandmother; Heart disease in his maternal grandfather, maternal grandmother, and mother.       Objective:    BP 116/78  - Pulse 87  - Temp 36.3 ??C (97.4 ??F) (Temporal)  - Ht 175.3 cm (5' 9)  - Wt 67.1 kg (148 lb)  - SpO2 98%  - BMI 21.86 kg/m??  - Body mass index is 21.86 kg/m??.    Physical exam:  General:  Well appearing, well nourished in no distress.   Skin: no obvious rash or  prominent lesions    Cardiovascular: no obvious JVD  Eyes:  conjunctiva clear, sclera non-icteric   Ears/nose/throat: no obvious external deformities  Respiratory: no distress    Musculoskeletal:  Normal gait and station.  Neurologic: moves extremities symmetrically, cranial nerves grossly intact  Hematologic: no obvious ecchymosis     Psychiatric:  Oriented X3, intact judgement and insight, normal mood and affect.       No results found for this or any previous visit (from the past 24 hour(s)).     I attest that I, Selinda Orion personally documented this note while acting as scribe for Dr. Rosemarie Ax, MD.      Selinda Orion, Scribe.    The documentation recorded by the scribe accurately reflects the service I personally performed and the decisions made by me.      Dr. Rosemarie Ax, MD  June 07, 2020 3:43 PM

## 2020-06-10 MED ORDER — SUBOXONE 8 MG-2 MG SUBLINGUAL FILM
ORAL_FILM | Freq: Three times a day (TID) | SUBLINGUAL | 0 refills | 12.00000 days | Status: CP
Start: 2020-06-10 — End: 2020-06-16

## 2020-06-15 DIAGNOSIS — F1190 Opioid use, unspecified, uncomplicated: Principal | ICD-10-CM

## 2020-06-15 MED ORDER — SUBOXONE 8 MG-2 MG SUBLINGUAL FILM
SUBLINGUAL | 0 refills | 4.00000 days | Status: CP
Start: 2020-06-15 — End: 2020-06-19

## 2020-06-15 NOTE — Unmapped (Signed)
pdmp shows filled 12d supply 06/03/20. Filled 30 d 05/07/20. Next OV 06/18/20. 4d supply sent in

## 2020-06-16 MED ORDER — GABAPENTIN 300 MG CAPSULE
ORAL_CAPSULE | 2 refills | 0.00000 days
Start: 2020-06-16 — End: ?

## 2020-06-16 MED ORDER — SUBOXONE 8 MG-2 MG SUBLINGUAL FILM
ORAL_FILM | Freq: Three times a day (TID) | SUBLINGUAL | 0 refills | 30 days
Start: 2020-06-16 — End: 2020-07-16

## 2020-06-18 ENCOUNTER — Ambulatory Visit: Admit: 2020-06-18 | Discharge: 2020-06-19 | Payer: PRIVATE HEALTH INSURANCE

## 2020-06-18 DIAGNOSIS — F1910 Other psychoactive substance abuse, uncomplicated: Principal | ICD-10-CM

## 2020-06-18 DIAGNOSIS — F1190 Opioid use, unspecified, uncomplicated: Principal | ICD-10-CM

## 2020-06-18 DIAGNOSIS — M5441 Lumbago with sciatica, right side: Principal | ICD-10-CM

## 2020-06-18 DIAGNOSIS — G8929 Other chronic pain: Principal | ICD-10-CM

## 2020-06-18 DIAGNOSIS — Z23 Encounter for immunization: Principal | ICD-10-CM

## 2020-06-18 DIAGNOSIS — F5104 Psychophysiologic insomnia: Principal | ICD-10-CM

## 2020-06-18 DIAGNOSIS — M5442 Lumbago with sciatica, left side: Principal | ICD-10-CM

## 2020-06-18 LAB — TOXICOLOGY SCREEN, URINE
AMPHETAMINE SCREEN URINE: NEGATIVE
BARBITURATE SCREEN URINE: NEGATIVE
BENZODIAZEPINE SCREEN, URINE: NEGATIVE
BUPRENORPHINE, URINE SCREEN: POSITIVE — AB
CANNABINOID SCREEN URINE: POSITIVE — AB
COCAINE(METAB.)SCREEN, URINE: NEGATIVE
FENTANYL SCREEN, URINE: NEGATIVE
METHADONE SCREEN, URINE: NEGATIVE
OPIATE SCREEN URINE: NEGATIVE
OXYCODONE SCREEN URINE: NEGATIVE

## 2020-06-18 MED ORDER — HYDROXYZINE PAMOATE 25 MG CAPSULE
ORAL_CAPSULE | Freq: Every evening | ORAL | 11 refills | 30 days | Status: CP | PRN
Start: 2020-06-18 — End: 2021-06-18

## 2020-06-18 NOTE — Unmapped (Addendum)
Hx of opioid pill, cocaine, heroin and marijuana use.   Overall had been stable on Suboxone??last??76mos; seen in ED 02/14/20 after a heroin relapse (patient claims that was a story he made up to obtain Suboxone after his supply was taken)  ?? Last 7 UDSs were approp  ?? HIV neg 8/20. Known +hep C, currently being tx'd w/Mavyret by [facility]  ?? He has narcan nasal sprays at home provided by pharmacy for opioid overdose prevention  ?? Continue buprenorphine-naloxone 8-2mg  1 film TID as prescribed, 30d worth w/1 rf as now seeing pt q49mos  ?? Controlled substance contract signed??09/30/2019  ?? UDS collected today, 06/18/20  ?? PDMP today showed he last filled??30 day supply of??suboxone??05/07/20, 12 day supply 06/03/20. No inappropriate entries.  ?? Plan f/u in 2 months

## 2020-06-18 NOTE — Unmapped (Addendum)
Well controlled on current medication regimen  ?? Continue Vistaril 25 mg at bedtime as needed (taking most nights)  I have Strongly counseled patient to avoid taking too many sedative medications in a close time frame. Previously reviewed that on nights he plans to take Vistaril, he should take no more than 1 tab gabapentin 300 mg no later than 5 pm, Suboxone 1 film at 9 pm, then Vistaril 25 mg at bedtime??11pm.

## 2020-06-18 NOTE — Unmapped (Signed)
This patient has been disenrolled from the [facility] specialty pharmacy services due to therapy completion - expected therapy completion date: 07/24/20.  His last shipment of Mavyret will be delivered on 06/20/20.Marland Kitchen    Roderic Palau  [facility] Specialty Pharmacist

## 2020-06-18 NOTE — Unmapped (Signed)
See tab for OUD

## 2020-06-18 NOTE — Unmapped (Signed)
[facility] Clinical Assessment & Refill Coordination Note    Harold Fitzpatrick, DOB: 1980-12-02  Phone: There are no phone numbers on file.    All above HIPAA information was verified with patient.     Was a Nurse, learning disability used for this call? No    Specialty Medication(s):   Infectious Disease: Mavyret     Current Outpatient Medications   Medication Sig Dispense Refill   ??? docusate sodium (COLACE) 100 MG capsule Take 1 capsule (100 mg total) by mouth daily. 90 capsule 3   ??? fluticasone propionate (FLONASE) 50 mcg/actuation nasal spray 2 sprays into each nostril.     ??? gabapentin (NEURONTIN) 300 MG capsule 1-2 tab po at 5pm for back pain/sciatica 60 capsule 2   ??? glecaprevir-pibrentasvir (MAVYRET) 100-40 mg tablet Take 3 tablets by mouth daily. Take with food. 1 4-week carton 1   ??? hydrOXYzine (VISTARIL) 25 MG capsule Take 1 capsule (25 mg total) by mouth nightly as needed (sleep). 30 capsule 5   ??? lactulose (CHRONULAC) 10 gram/15 mL (15 mL) Soln Take 30 mL (20 g total) by mouth Two (2) times a day. For constipation 900 mL 0   ??? SUBOXONE 8-2 mg sublingual film Place 1 Film (8 mg of buprenorphine total) under the tongue Three (3) times a day for 12 days. 36 Film 0   ??? SUBOXONE 8-2 mg sublingual film PLACE 1 STRIP UNDER THE TONGUE THREE TIMES DAILY 12 each 0     No current facility-administered medications for this visit.        Changes to medications: Harold Fitzpatrick reports no changes at this time.    Allergies   Allergen Reactions   ??? Acetaminophen Hives       Changes to allergies: No    SPECIALTY MEDICATION ADHERENCE     Mavyret   : 7 days of medicine on hand       Medication Adherence    Patient reported X missed doses in the last month: 0  Specialty Medication: Mavyret  Patient is on additional specialty medications: No  Demonstrates understanding of importance of adherence: yes  Informant: patient  Provider-estimated medication adherence level: good  Patient is at risk for Non-Adherence: No Specialty medication(s) dose(s) confirmed: Regimen is correct and unchanged.     Are there any concerns with adherence? No    Adherence counseling provided? Not needed    CLINICAL MANAGEMENT AND INTERVENTION      Clinical Benefit Assessment:    Do you feel the medicine is effective or helping your condition? Yes. He stated he feels much better.    Clinical Benefit counseling provided? Not needed    Adverse Effects Assessment:    Are you experiencing any side effects? No    Are you experiencing difficulty administering your medicine? No    Quality of Life Assessment:    How many days over the past month did your Hepatitis C  keep you from your normal activities? For example, brushing your teeth or getting up in the morning. 0    Have you discussed this with your provider? Not needed    Therapy Appropriateness:    Is therapy appropriate? Yes, therapy is appropriate and should be continued    DISEASE/MEDICATION-SPECIFIC INFORMATION      For Hepatitis C patients (clinical assessment):  Regimen: Mavyret x 8 weeks  Therapy start date: 05/29/20  Completed Treatment Week #: 3  What time of day do you take your medicine? midday with  lunch  Unable to perform a pill count over the phone,  He stated he has 7 days of medicine on hand which if correct is appropriate.  Are you taking any new OTC or herbal medication? No  Any alcoholic beverages? No  Do you have a follow up appointment? No, appointment is not scheduled, I notified clinic and instructed to call [phone] option1, option 1 to schedule this appointment.  As requested by the patient, I sent him the phone number to call in a my Chart message.    PATIENT SPECIFIC NEEDS     - Does the patient have any physical, cognitive, or cultural barriers? No    - Is the patient high risk? No    - Does the patient require a Care Management Plan? No     - Does the patient require physician intervention or other additional services (i.e. nutrition, smoking cessation, social work)? No      SHIPPING     Specialty Medication(s) to be Shipped:   Infectious Disease: Mavyret    Other medication(s) to be shipped: No additional medications requested for fill at this time     Changes to insurance: No    Delivery Scheduled: Yes, Expected medication delivery date: 06/20/20.     Medication will be delivered via Next Day Courier to the confirmed prescription address in [facility].    The patient will receive a drug information handout for each medication shipped and additional FDA Medication Guides as required.  Verified that patient has previously received a Conservation officer, historic buildings.    All of the patient's questions and concerns have been addressed.    Harold Fitzpatrick   [facility] Specialty Pharmacist

## 2020-06-18 NOTE — Unmapped (Addendum)
Stable/well controlled on gabapentin 300 mg 1-2 tabs qPM -MRI L-spine on 06/06/2019 showed herniated discs at L4-L5 and L5-S1.????  ?? Continue 1 or 2 tabs of gabapentin 300 mg nightly as needed. Previously emphasized that patient should take no more than 1 tab gabapentin earlier in the evening (~5 pm) if he plans to take vistaril for sleep that night as gabapentin, Suboxone, and Vistaril all have sedative effects which can be dangerous if combined.  ??

## 2020-06-19 MED FILL — MAVYRET 100 MG-40 MG TABLET: ORAL | 28 days supply | Qty: 1 | Fill #1

## 2020-06-23 IMAGING — CR DG LUMBAR SPINE 2-3V
3 series · 3 of 3 positions shown · non-contrast
Comparison: None.

CLINICAL DATA: Low back pain following heavy lifting, initial
encounter

EXAM:
LUMBAR SPINE - 2-3 VIEW

[l-spine ap]
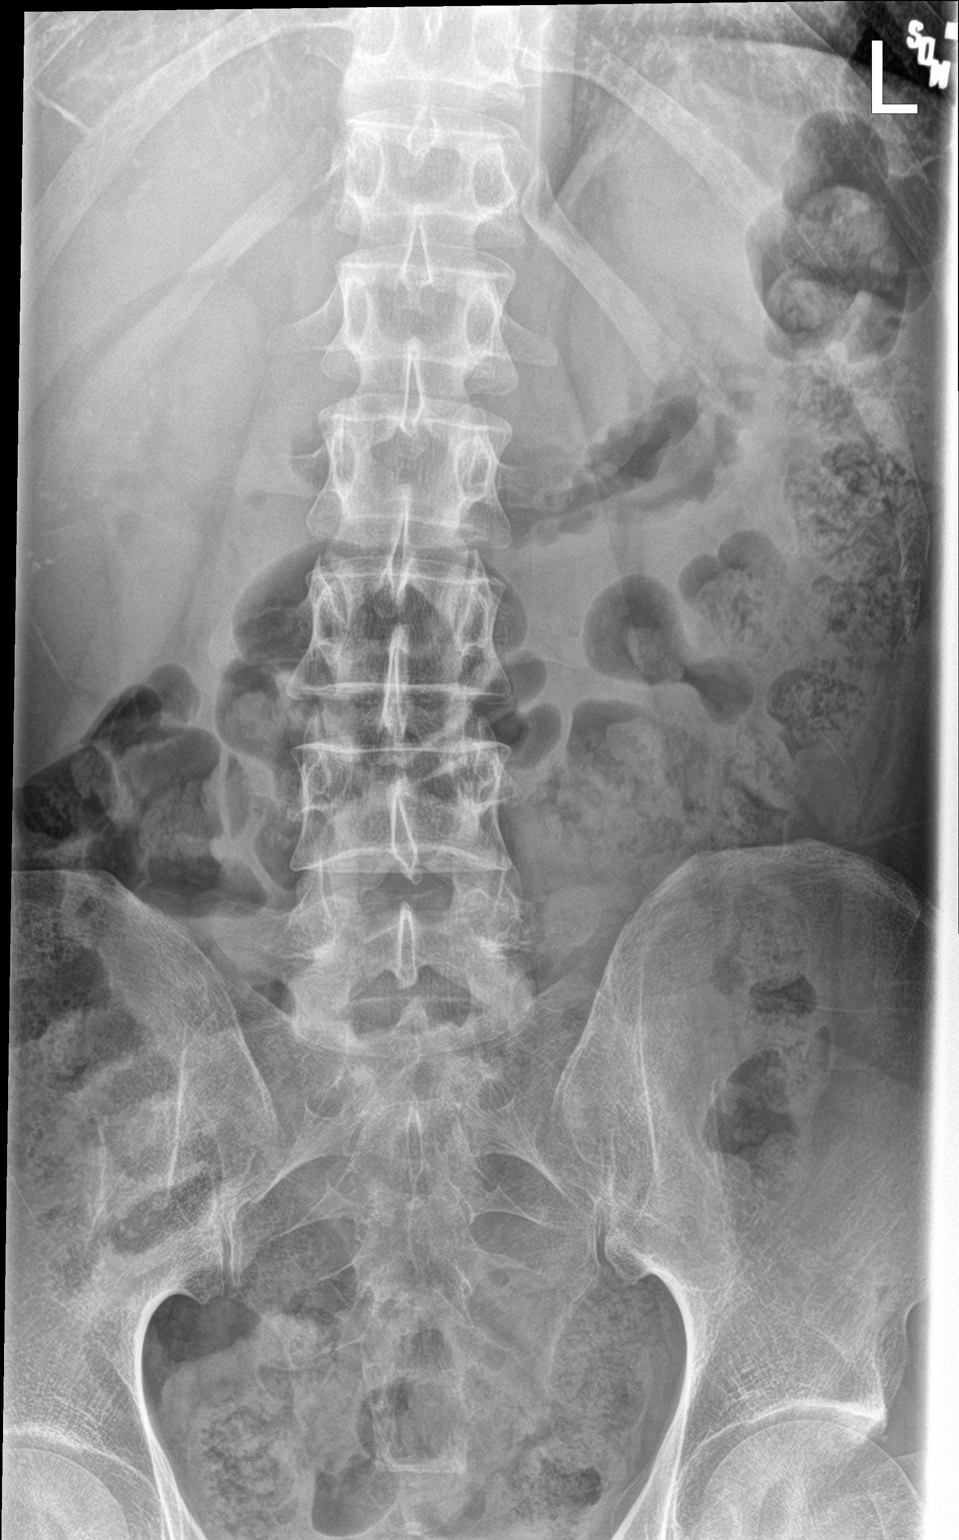

[l-spine lat]
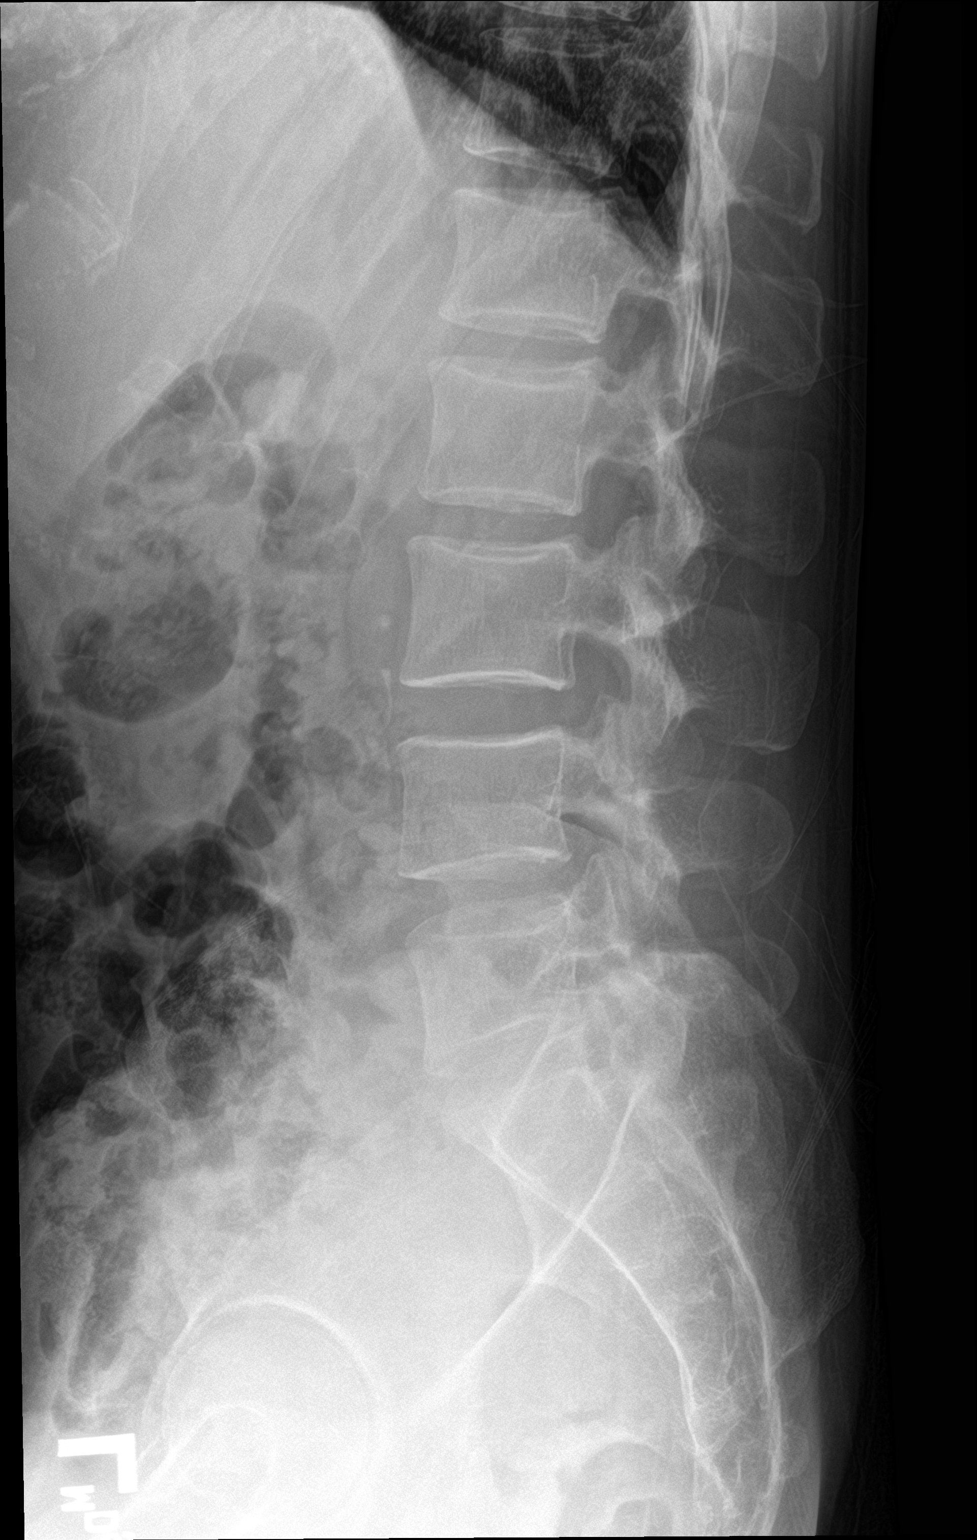

[l-spine spot]
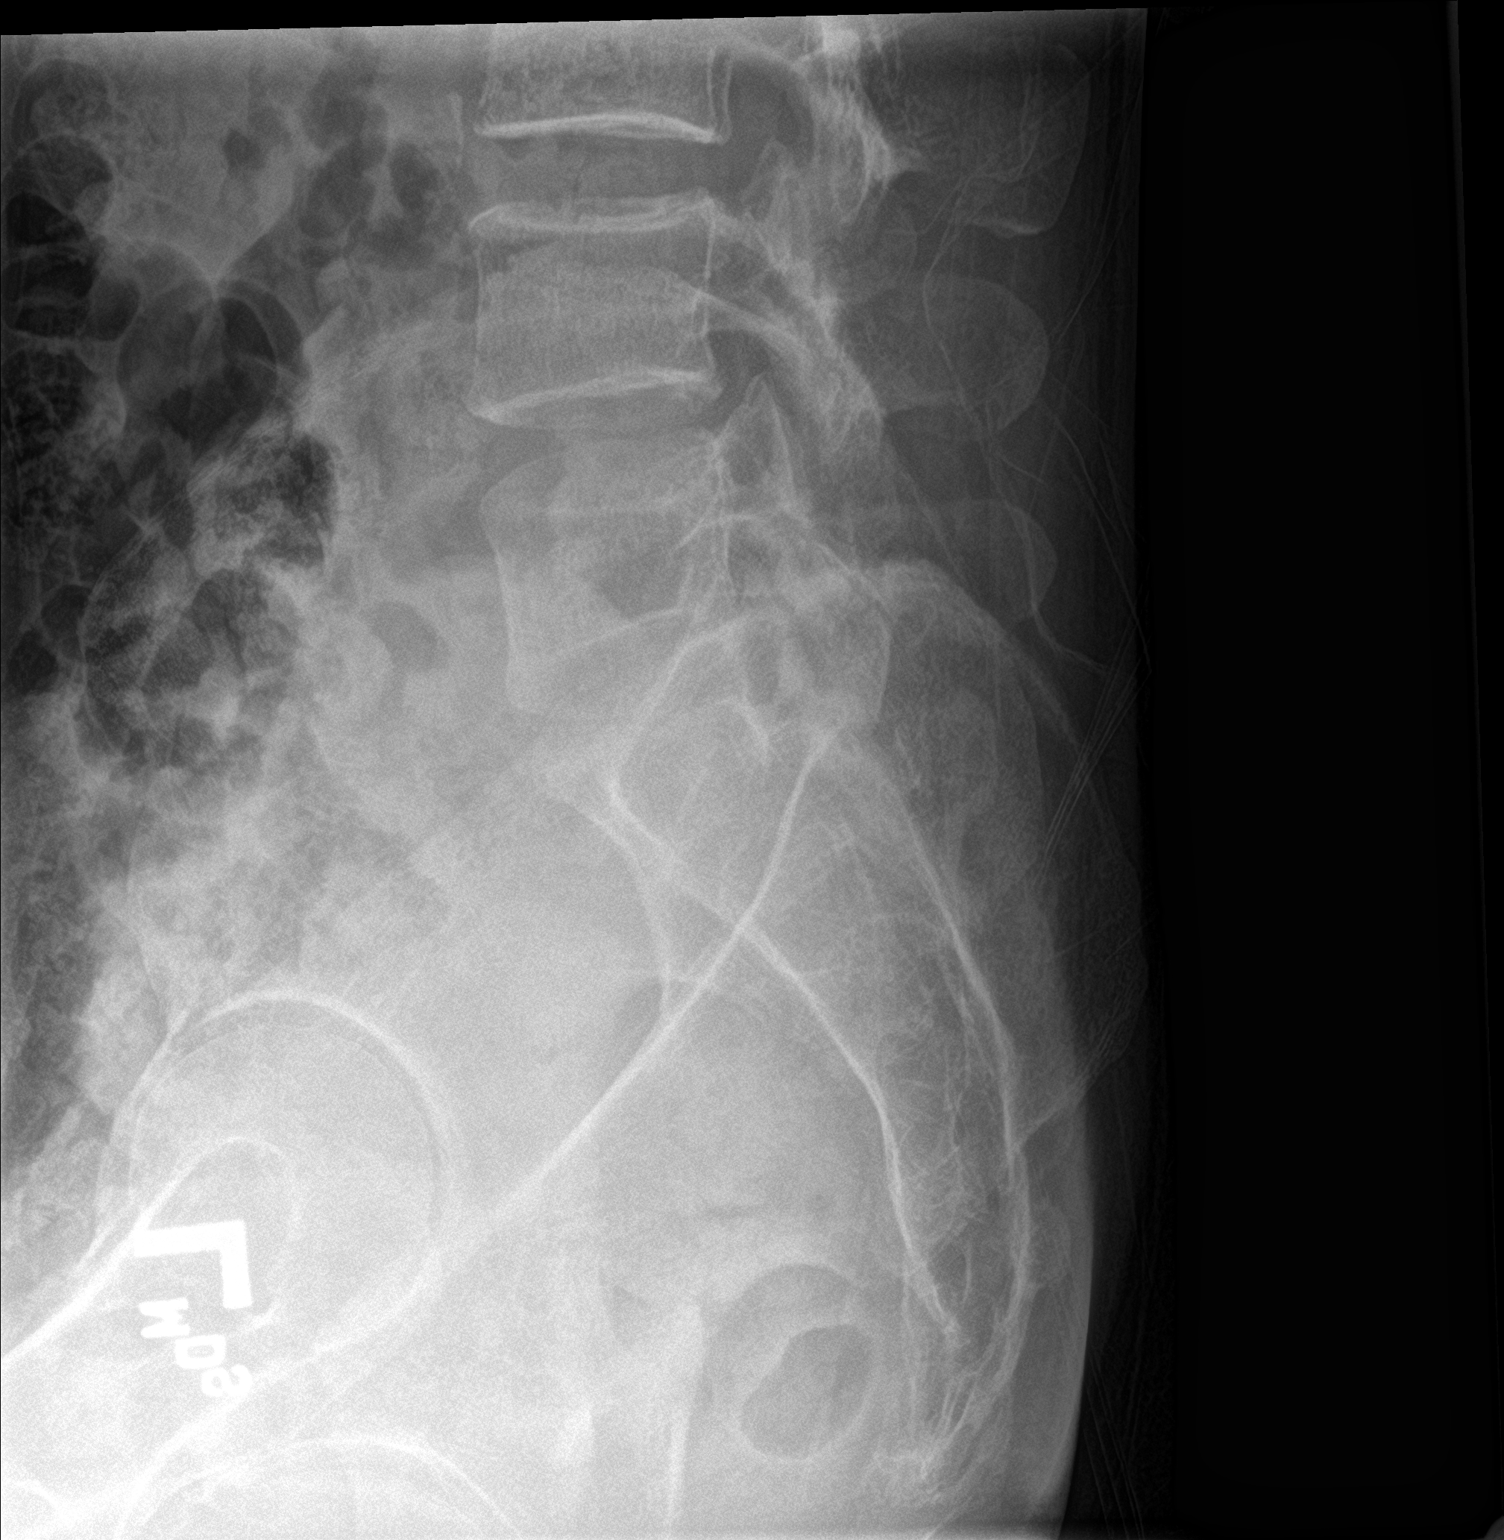

[3 of 3 positions shown; findings below may reference images not displayed]

FINDINGS: Five lumbar type vertebral bodies are well visualized. Vertebral
body height is well maintained. No compression deformity is noted.
No anterolisthesis is seen. No soft tissue abnormality is noted.
IMPRESSION: No acute abnormality noted.

## 2020-06-23 IMAGING — US US SCROTUM W/ DOPPLER COMPLETE
1 series · 14 of 25 positions shown · non-contrast
Comparison: None.

CLINICAL DATA: 38-year-old male with bilateral testicular pain.

EXAM:
SCROTAL ULTRASOUND
DOPPLER ULTRASOUND OF THE TESTICLES
TECHNIQUE: Complete ultrasound examination of the testicles, epididymis, and
other scrotal structures was performed. Color and spectral Doppler
ultrasound were also utilized to evaluate blood flow to the
testicles.

[Series 1: us scrotum w/ doppler complete · 0.07mm/px · 14 of 45 slices shown]
[im 1/45]
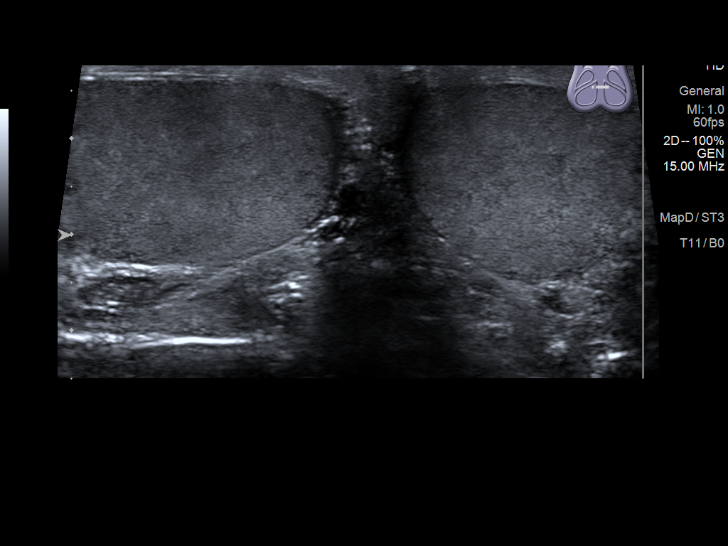
[im 4/45]
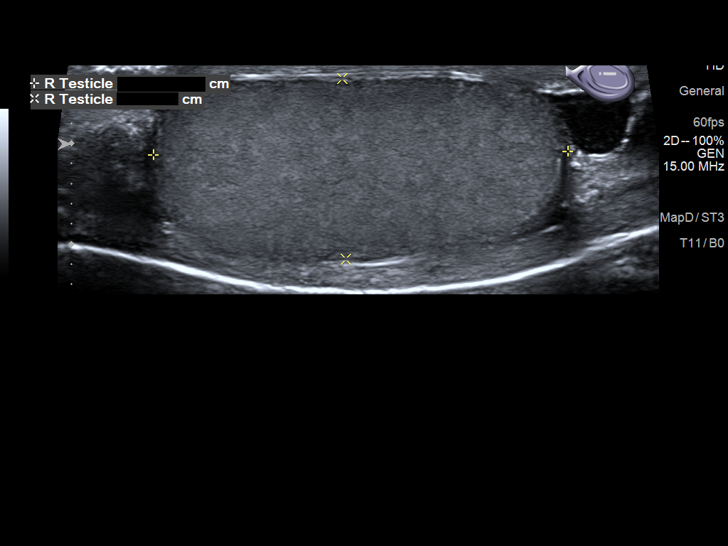
[im 8/45]
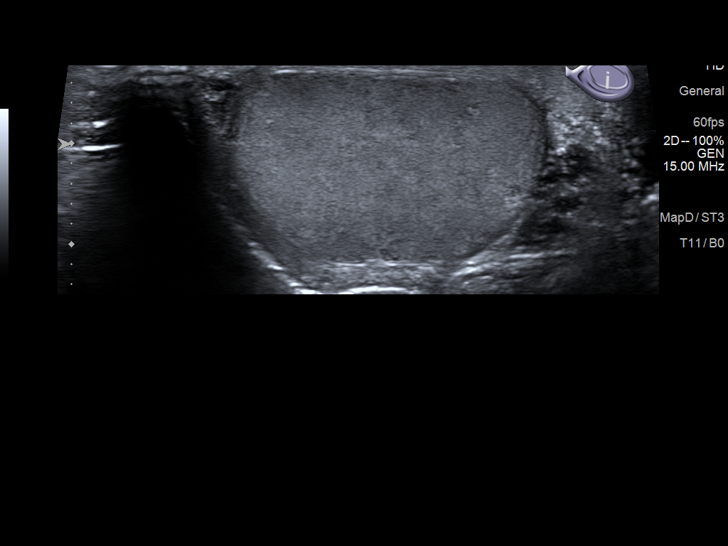
[im 12/45]
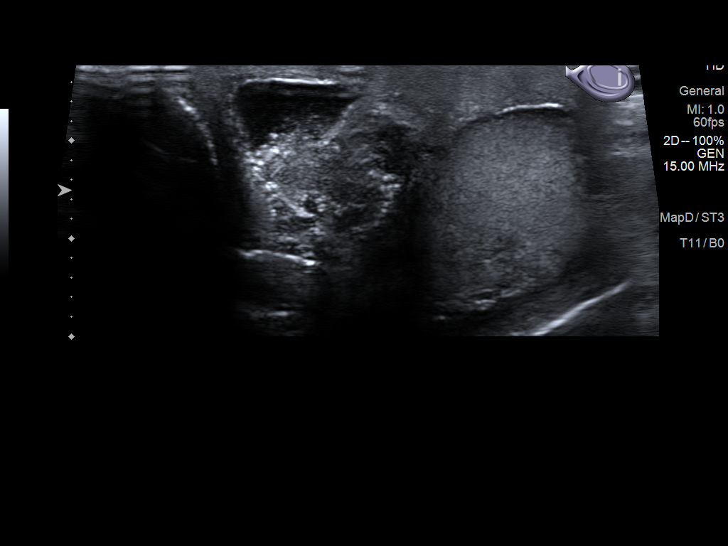
[im 15/45]
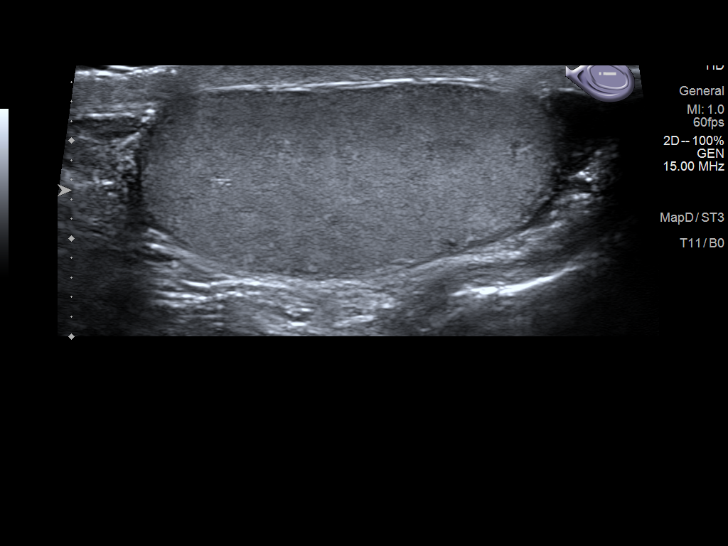
[im 17/45]
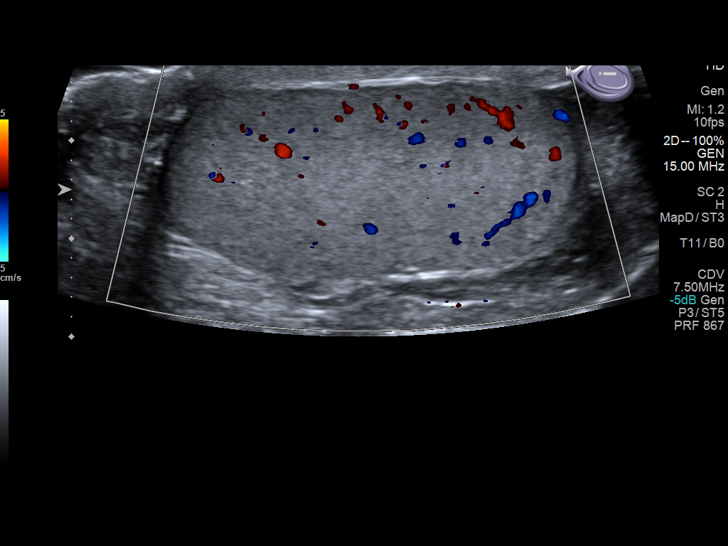
[im 21/45]
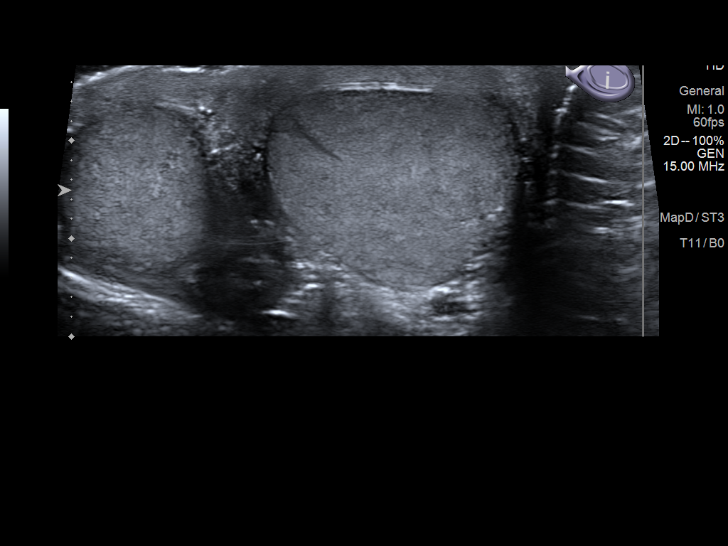
[im 24/45]
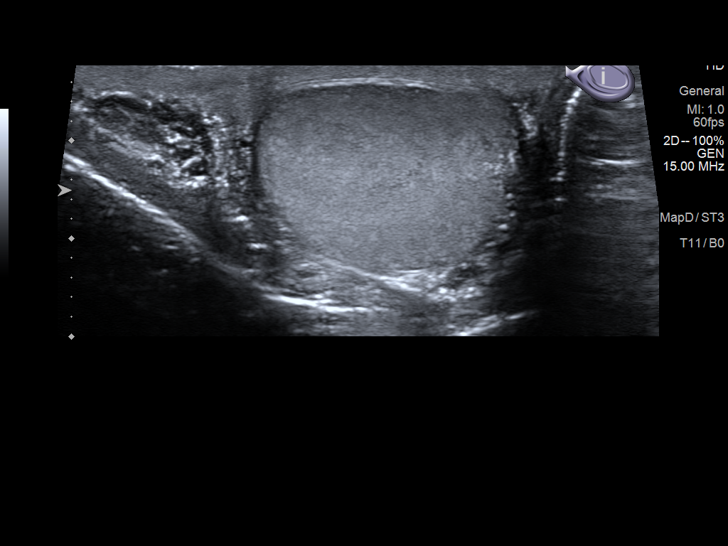
[im 28/45]
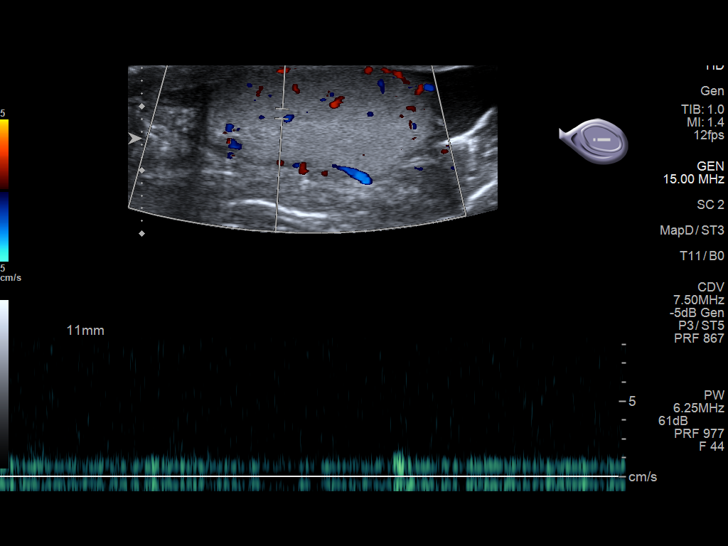
[im 30/45]
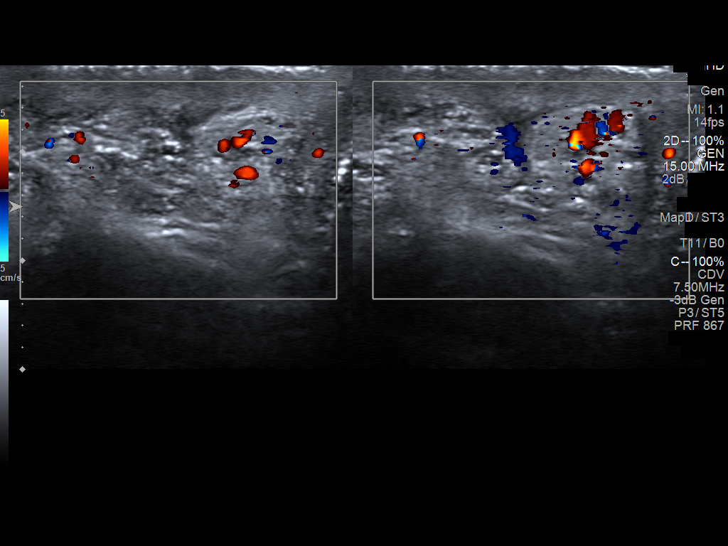
[im 34/45]
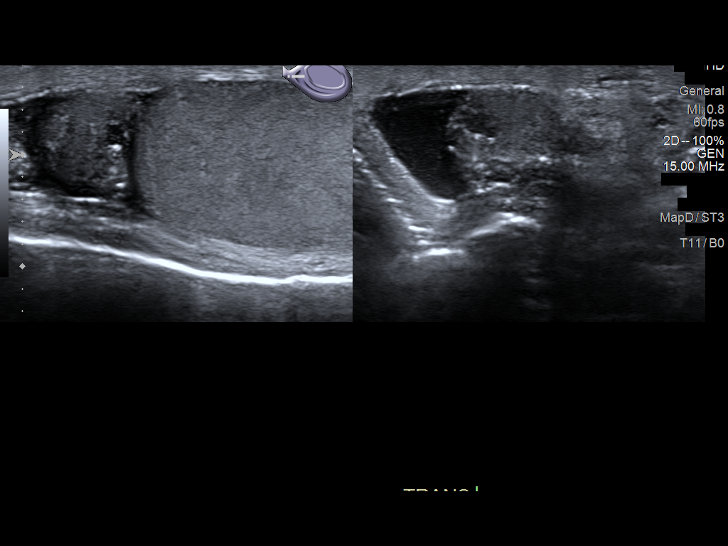
[im 37/45]
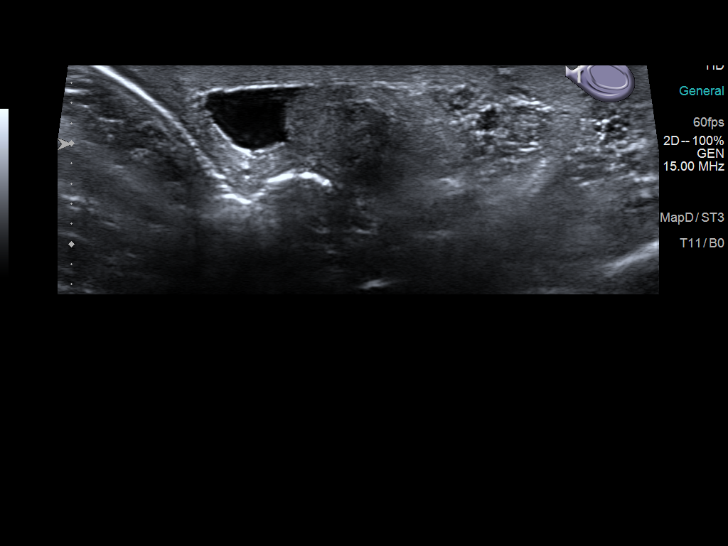
[im 41/45]
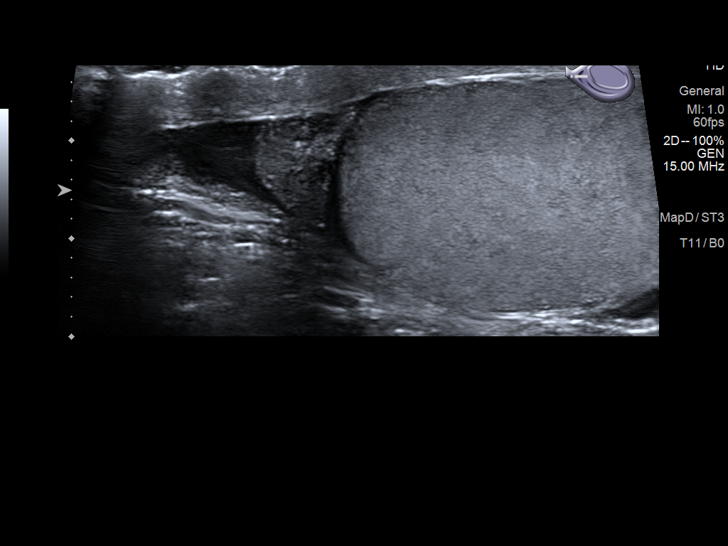
[im 45/45]
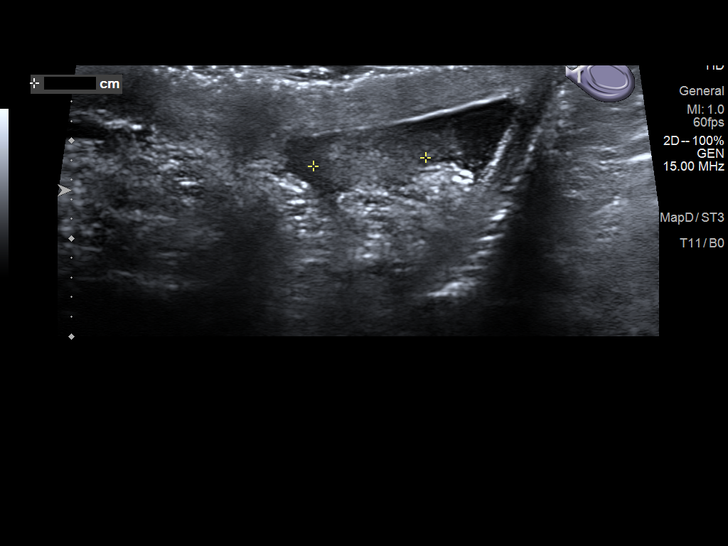

[14 of 25 positions shown; findings below may reference images not displayed]

FINDINGS: Right testicle

Measurements: 4.1 x 1.8 x 3.1 cm. No mass or microlithiasis
visualized.

Left testicle

Measurements: 4.3 x 1.9 x 2.6 cm. No mass or microlithiasis
visualized.

Right epididymis: Normal in size and appearance. A 2 mm right
epididymal head cyst.

Left epididymis:  Normal in size and appearance.

Hydrocele:  Minimal bilateral hydroceles.

Varicocele:  None visualized.

Pulsed Doppler interrogation of both testes demonstrates normal low
resistance arterial and venous waveforms bilaterally.
IMPRESSION: Unremarkable testicular ultrasound.

## 2020-08-09 ENCOUNTER — Ambulatory Visit: Admit: 2020-08-09 | Discharge: 2020-08-10 | Payer: PRIVATE HEALTH INSURANCE | Attending: Family | Primary: Family

## 2020-08-09 DIAGNOSIS — Z23 Encounter for immunization: Principal | ICD-10-CM

## 2020-08-09 DIAGNOSIS — B182 Chronic viral hepatitis C: Principal | ICD-10-CM

## 2020-08-11 DIAGNOSIS — F1190 Opioid use, unspecified, uncomplicated: Principal | ICD-10-CM

## 2020-08-11 MED ORDER — SUBOXONE 8 MG-2 MG SUBLINGUAL FILM
Freq: Three times a day (TID) | SUBLINGUAL | 0 refills | 0.00000 days | Status: CP
Start: 2020-08-11 — End: 2020-10-10

## 2020-08-13 MED ORDER — SUBOXONE 8 MG-2 MG SUBLINGUAL FILM
ORAL_FILM | Freq: Three times a day (TID) | SUBLINGUAL | 1 refills | 30.00000 days
Start: 2020-08-13 — End: 2020-10-12

## 2020-08-29 DIAGNOSIS — J309 Allergic rhinitis, unspecified: Principal | ICD-10-CM

## 2020-08-29 MED ORDER — FLUTICASONE PROPIONATE 50 MCG/ACTUATION NASAL SPRAY,SUSPENSION
Freq: Every day | NASAL | 11 refills | 0.00000 days | Status: CP
Start: 2020-08-29 — End: 2021-08-29

## 2020-08-31 ENCOUNTER — Ambulatory Visit: Admit: 2020-08-31 | Payer: PRIVATE HEALTH INSURANCE

## 2020-09-07 DIAGNOSIS — F1190 Opioid use, unspecified, uncomplicated: Principal | ICD-10-CM

## 2020-09-09 MED ORDER — SUBOXONE 8 MG-2 MG SUBLINGUAL FILM
ORAL_FILM | Freq: Three times a day (TID) | SUBLINGUAL | 0 refills | 17.00000 days | Status: CP
Start: 2020-09-09 — End: 2020-09-26

## 2020-09-10 ENCOUNTER — Ambulatory Visit: Admit: 2020-09-10 | Discharge: 2020-09-11 | Payer: PRIVATE HEALTH INSURANCE

## 2020-09-10 DIAGNOSIS — Z20822 Close exposure to COVID-19 virus: Principal | ICD-10-CM

## 2020-09-10 DIAGNOSIS — R519 Acute nonintractable headache, unspecified headache type: Principal | ICD-10-CM

## 2020-09-26 ENCOUNTER — Ambulatory Visit: Admit: 2020-09-26 | Discharge: 2020-09-27 | Payer: PRIVATE HEALTH INSURANCE

## 2020-09-26 DIAGNOSIS — F1190 Opioid use, unspecified, uncomplicated: Principal | ICD-10-CM

## 2020-09-26 DIAGNOSIS — B356 Tinea cruris: Principal | ICD-10-CM

## 2020-09-26 MED ORDER — SUBOXONE 8 MG-2 MG SUBLINGUAL FILM
ORAL_FILM | Freq: Three times a day (TID) | SUBLINGUAL | 1 refills | 30.00000 days | Status: CP
Start: 2020-09-26 — End: 2020-11-25

## 2020-09-26 MED ORDER — NAFTIFINE 2 % TOPICAL CREAM
Freq: Every day | TOPICAL | 4 refills | 0 days | Status: CP
Start: 2020-09-26 — End: 2020-10-10

## 2020-10-10 ENCOUNTER — Encounter: Payer: Self-pay | Admitting: Emergency Medicine

## 2020-10-10 ENCOUNTER — Ambulatory Visit
Admission: EM | Admit: 2020-10-10 | Discharge: 2020-10-10 | Disposition: A | Discharge status: Home / Self Care | Payer: Medicaid Other

## 2020-10-10 ENCOUNTER — Other Ambulatory Visit: Payer: Self-pay

## 2020-10-10 DIAGNOSIS — B349 Viral infection, unspecified: Secondary | ICD-10-CM | POA: Diagnosis not present

## 2020-10-10 DIAGNOSIS — R110 Nausea: Secondary | ICD-10-CM

## 2020-10-10 DIAGNOSIS — R197 Diarrhea, unspecified: Secondary | ICD-10-CM

## 2020-10-10 MED ORDER — ONDANSETRON HCL 4 MG PO TABS: 4.0000 mg | ORAL_TABLET | Freq: Three times a day (TID) | ORAL | 0 refills | Status: DC | PRN

## 2020-10-10 NOTE — ED Provider Notes (Signed)
[facility]    CSN: [id number] Arrival date & time: 10/10/20  1905      History   Chief Complaint Chief Complaint  Patient presents with   Nausea   Facial Pain    HPI Jared Trujillo is a 40 y.o. male presenting for approximately 4-day history of fatigue, nausea and diarrhea.  He also admits to some stomach upset, bilateral ear pressure and sinus pressure.  Has had some minor congestion as well.  Patient says that his daughter has similar symptoms and was recently diagnosed with a stomach virus.  Patient admits to history of COVID-19 about 3 weeks ago.  States he was ill for about 4 days and then got better.  He is not currently take any medication for his symptoms.  He has not had any fevers, cough, sore throat, chest discomfort, breathing difficulty.  No other complaints or concerns.  HPI  Past Medical History:  Diagnosis Date   History of drug abuse (HCC)     There are no problems to display for this patient.   Past Surgical History:  Procedure Laterality Date   MOUTH SURGERY     WRIST FRACTURE SURGERY Left        Home Medications    Prior to Admission medications   Medication Sig Start Date End Date Taking? Authorizing Provider  buprenorphine-naloxone (SUBOXONE) 8-2 mg SUBL SL tablet Place 1 tablet under the tongue daily.   Yes [provider]  hydrOXYzine (VISTARIL) 25 MG capsule Take by mouth. 06/18/20 06/18/21 Yes [provider]  ondansetron (ZOFRAN) 4 MG tablet Take 1 tablet (4 mg total) by mouth every 8 (eight) hours as needed for nausea or vomiting. 10/10/20  Yes Eusebio Friendly B, PA-C  ARIPiprazole (ABILIFY) 10 MG tablet Take 10 mg by mouth daily.    [provider]  dicyclomine (BENTYL) 10 MG capsule Take 1 capsule (10 mg total) by mouth 3 (three) times daily as needed for up to 7 days for spasms. 02/14/20 02/21/20  Darci Current, MD  fluticasone (FLONASE) 50 MCG/ACT nasal spray Place 2 sprays into both nostrils  daily. 08/29/19   Anson Oregon, PA-C  gabapentin (NEURONTIN) 300 MG capsule SMARTSIG:1-2 Capsule(s) By Mouth Every Evening 09/21/20   [provider]  methocarbamol (ROBAXIN) 500 MG tablet Take 1 tablet (500 mg total) by mouth every 6 (six) hours as needed. 03/29/19   Tommi Rumps, PA-C  methylPREDNISolone (MEDROL DOSEPAK) 4 MG TBPK tablet Take per package instructions. 08/29/19   Anson Oregon, PA-C  ondansetron (ZOFRAN ODT) 4 MG disintegrating tablet Take 1 tablet (4 mg total) by mouth every 8 (eight) hours as needed. 02/14/20   Darci Current, MD  oxyCODONE (OXY IR/ROXICODONE) 5 MG immediate release tablet Take 1 tablet (5 mg total) by mouth every 6 (six) hours as needed for severe pain. 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 Negative for fever.  HENT:  Positive for congestion, ear pain, rhinorrhea and sinus pressure. Negative for sore throat.   Respiratory:  Negative for cough and shortness of breath.   Cardiovascular:  Negative for chest pain.  Gastrointestinal:  Positive for diarrhea and nausea. Negative for abdominal pain and vomiting.  Musculoskeletal:  Negative for myalgias.  Neurological:  Negative for weakness, light-headedness and headaches.  Hematological:  Negative for adenopathy.    Physical Exam Triage Vital Signs ED Triage  Vitals [10/10/20 1917]  Enc Vitals Group     BP 113/79     Pulse Rate (!) 112     Resp 18     Temp 98.5 F (36.9 C)     Temp Source Oral     SpO2 98 %     Weight 145 lb 1 oz (65.8 kg)     Height 5\' 9"  (1.753 m)     Head Circumference      Peak Flow      Pain Score 2     Pain Loc      Pain Edu?      Excl. in GC?    No data found.  Updated Vital Signs BP 113/79 (BP Location: Right Arm)   Pulse (!) 112   Temp 98.5 F (36.9 C) (Oral)   Resp 18   Ht 5\' 9"  (1.753 m)   Wt 145 lb 1 oz (65.8 kg)   SpO2 98%   BMI 21.42 kg/m       Physical Exam Vitals and nursing note reviewed.  Constitutional:      General: He is not in acute distress.    Appearance: Normal appearance. He is well-developed. He is ill-appearing. He is not diaphoretic.  HENT:     Head: Normocephalic and atraumatic.     Right Ear: Tympanic membrane, ear canal and external ear normal.     Left Ear: Tympanic membrane, ear canal and external ear normal.     Nose: Congestion and rhinorrhea present.     Mouth/Throat:     Mouth: Mucous membranes are moist.     Pharynx: Oropharynx is clear. Uvula midline. No posterior oropharyngeal erythema.     Tonsils: No tonsillar abscesses.  Eyes:     General: No scleral icterus.       Right eye: No discharge.        Left eye: No discharge.     Conjunctiva/sclera: Conjunctivae normal.  Neck:     Thyroid: No thyromegaly.     Trachea: No tracheal deviation.  Cardiovascular:     Rate and Rhythm: Regular rhythm. Tachycardia present.     Heart sounds: Normal heart sounds.  Pulmonary:     Effort: Pulmonary effort is normal. No respiratory distress.     Breath sounds: Normal breath sounds. No wheezing, rhonchi or rales.  Abdominal:     Palpations: Abdomen is soft.     Tenderness: There is no abdominal tenderness.  Musculoskeletal:     Cervical back: Neck supple.  Lymphadenopathy:     Cervical: No cervical adenopathy.  Skin:    General: Skin is warm and dry.      Findings: No rash.  Neurological:     General: No focal deficit present.     Mental Status: He is alert. Mental status  is at baseline.     Motor: No weakness.     Gait: Gait normal.  Psychiatric:        Mood and Affect: Mood normal.        Behavior: Behavior normal.        Thought Content: Thought content normal.     UC Treatments / Results  Labs (all labs ordered are listed, but only abnormal results are displayed) Labs Reviewed - No data to display  EKG   Radiology No results found.  Procedures Procedures (including critical care time)  Medications Ordered in UC Medications - No data to display  Initial Impression / Assessment and Plan / UC Course  I have reviewed the triage vital signs and the nursing notes.  Pertinent labs & imaging results that were available during my care of the patient were reviewed by me and considered in my medical decision making (see chart for details).   40 y/o male presenting for 4 day history of nausea, fatigue, diarrhea, bilateral ear pressure and congestion.  Daughter has similar symptoms.  Recent COVID-19 illness about 3 weeks ago.  Patient's vital signs are stable clinic today.  He is ill-appearing, but nontoxic.  He has minor congestion and clear rhinorrhea.  No evidence of ear infection.  Chest clear to auscultation heart regular rate and rhythm.  No abdominal tenderness.  Advised him he has a viral illness.  Advised to increase rest and fluids.  Sent in Zofran and provided with a work note.  Advised to follow-up with our department if not feeling better in the next 3 to 5 days or for any worsening symptoms.  ED precautions reviewed.  Final Clinical Impressions(s) / UC Diagnoses   Final diagnoses:  Viral illness  Nausea  Diarrhea, unspecified type     Discharge Instructions      Symptoms consistent with viral illness.  Increase rest and fluids.  I have sent to me for the nausea.  You can take over-the-counter Imodium or  Pepto-Bismol for the diarrhea.  Make sure he staying hydrated well especially if you are having diarrhea.  Can take over-the-counter Mucinex for congestion.  Should be able to go back to work after the next couple of days.  Can go back tomorrow if you are feeling better.  Follow-up if you are not feeling better over the next 5 to 7 days or if you have any worsening symptoms.     ED Prescriptions     Medication Sig Dispense Auth. Provider   ondansetron (ZOFRAN) 4 MG tablet Take 1 tablet (4 mg total) by mouth every 8 (eight) hours as needed for nausea or vomiting. 12 tablet Gareth Morgan      PDMP not reviewed this encounter.   Shirlee Latch, PA-C 10/10/20 2007

## 2020-10-10 NOTE — ED Triage Notes (Signed)
Pt c/o nausea, bilateral ear pressure, right ear pain, sinus pressure. Started about 4 days ago. He states he had covid a couple of weeks ago.

## 2020-10-10 NOTE — Discharge Instructions (Addendum)
Symptoms consistent with viral illness.  Increase rest and fluids.  I have sent to me for the nausea.  You can take over-the-counter Imodium or Pepto-Bismol for the diarrhea.  Make sure he staying hydrated well especially if you are having diarrhea.  Can take over-the-counter Mucinex for congestion.  Should be able to go back to work after the next couple of days.  Can go back tomorrow if you are feeling better.  Follow-up if you are not feeling better over the next 5 to 7 days or if you have any worsening symptoms.

## 2020-10-25 ENCOUNTER — Ambulatory Visit: Admit: 2020-10-25 | Payer: PRIVATE HEALTH INSURANCE | Attending: Family | Primary: Family

## 2020-11-02 DIAGNOSIS — B182 Chronic viral hepatitis C: Principal | ICD-10-CM

## 2020-11-12 DIAGNOSIS — F1190 Opioid use, unspecified, uncomplicated: Principal | ICD-10-CM

## 2020-11-15 ENCOUNTER — Ambulatory Visit
Admit: 2020-11-15 | Payer: PRIVATE HEALTH INSURANCE | Attending: Pharmacist Clinician (PhC)/ Clinical Pharmacy Specialist | Primary: Pharmacist Clinician (PhC)/ Clinical Pharmacy Specialist

## 2020-11-21 MED ORDER — SUBOXONE 8 MG-2 MG SUBLINGUAL FILM
0 refills | 30 days | Status: CP
Start: 2020-11-21 — End: 2020-12-21

## 2020-12-15 DIAGNOSIS — F1190 Opioid use, unspecified, uncomplicated: Principal | ICD-10-CM

## 2020-12-15 MED ORDER — BUPRENORPHINE 8 MG-NALOXONE 2 MG SUBLINGUAL FILM
ORAL_FILM | Freq: Three times a day (TID) | SUBLINGUAL | 0 refills | 30 days | Status: CP
Start: 2020-12-15 — End: 2021-01-14

## 2020-12-26 MED ORDER — BUPRENORPHINE 8 MG-NALOXONE 2 MG SUBLINGUAL FILM
ORAL_FILM | Freq: Three times a day (TID) | SUBLINGUAL | 0 refills | 30.00000 days
Start: 2020-12-26 — End: 2021-01-25

## 2020-12-27 ENCOUNTER — Ambulatory Visit: Admit: 2020-12-27 | Payer: PRIVATE HEALTH INSURANCE

## 2021-01-06 MED ORDER — SUBOXONE 8 MG-2 MG SUBLINGUAL FILM
0 refills | 30 days
Start: 2021-01-06 — End: 2021-02-05

## 2021-01-06 MED ORDER — NALOXONE 4 MG/ACTUATION NASAL SPRAY
0 refills | 0 days
Start: 2021-01-06 — End: ?

## 2021-01-07 ENCOUNTER — Ambulatory Visit: Admit: 2021-01-07 | Discharge: 2021-01-07 | Payer: PRIVATE HEALTH INSURANCE

## 2021-01-07 DIAGNOSIS — R519 Nonintractable headache, unspecified chronicity pattern, unspecified headache type: Principal | ICD-10-CM

## 2021-01-07 DIAGNOSIS — R000 Tachycardia, unspecified: Principal | ICD-10-CM

## 2021-01-07 DIAGNOSIS — F1190 Opioid use, unspecified, uncomplicated: Principal | ICD-10-CM

## 2021-01-07 DIAGNOSIS — B182 Chronic viral hepatitis C: Principal | ICD-10-CM

## 2021-01-07 DIAGNOSIS — R12 Heartburn: Principal | ICD-10-CM

## 2021-01-07 MED ORDER — AMOXICILLIN 875 MG-POTASSIUM CLAVULANATE 125 MG TABLET
ORAL_TABLET | Freq: Two times a day (BID) | ORAL | 0 refills | 7 days | Status: CP
Start: 2021-01-07 — End: 2021-01-14

## 2021-01-11 ENCOUNTER — Telehealth: Admit: 2021-01-11 | Discharge: 2021-01-12 | Payer: PRIVATE HEALTH INSURANCE

## 2021-01-11 DIAGNOSIS — F1910 Other psychoactive substance abuse, uncomplicated: Principal | ICD-10-CM

## 2021-01-11 DIAGNOSIS — M5126 Other intervertebral disc displacement, lumbar region: Principal | ICD-10-CM

## 2021-01-11 DIAGNOSIS — M5442 Lumbago with sciatica, left side: Principal | ICD-10-CM

## 2021-01-11 DIAGNOSIS — M5441 Lumbago with sciatica, right side: Principal | ICD-10-CM

## 2021-01-11 DIAGNOSIS — G8929 Other chronic pain: Principal | ICD-10-CM

## 2021-01-11 DIAGNOSIS — F1190 Opioid use, unspecified, uncomplicated: Principal | ICD-10-CM

## 2021-01-11 MED ORDER — GABAPENTIN 300 MG CAPSULE
ORAL_CAPSULE | Freq: Two times a day (BID) | ORAL | 2 refills | 30 days | Status: CP
Start: 2021-01-11 — End: 2021-04-11

## 2021-01-13 MED ORDER — SUBOXONE 8 MG-2 MG SUBLINGUAL FILM
ORAL_FILM | Freq: Three times a day (TID) | SUBLINGUAL | 0 refills | 30.00000 days | Status: CP
Start: 2021-01-13 — End: 2021-02-12

## 2021-01-31 DIAGNOSIS — F1190 Opioid use, unspecified, uncomplicated: Principal | ICD-10-CM

## 2021-02-01 ENCOUNTER — Ambulatory Visit: Admit: 2021-02-01 | Payer: PRIVATE HEALTH INSURANCE

## 2021-02-10 MED ORDER — SUBOXONE 8 MG-2 MG SUBLINGUAL FILM
ORAL_FILM | Freq: Three times a day (TID) | SUBLINGUAL | 0 refills | 30 days | Status: CP
Start: 2021-02-10 — End: 2021-03-12

## 2021-02-22 DIAGNOSIS — G8929 Other chronic pain: Principal | ICD-10-CM

## 2021-02-22 DIAGNOSIS — M5442 Lumbago with sciatica, left side: Principal | ICD-10-CM

## 2021-02-22 DIAGNOSIS — M5126 Other intervertebral disc displacement, lumbar region: Principal | ICD-10-CM

## 2021-02-22 DIAGNOSIS — M5441 Lumbago with sciatica, right side: Principal | ICD-10-CM

## 2021-02-22 MED ORDER — GABAPENTIN 300 MG CAPSULE
ORAL_CAPSULE | Freq: Three times a day (TID) | ORAL | 0 refills | 30.00000 days | Status: CP
Start: 2021-02-22 — End: 2021-03-24

## 2021-03-07 ENCOUNTER — Ambulatory Visit: Admit: 2021-03-07 | Discharge: 2021-03-08 | Payer: PRIVATE HEALTH INSURANCE

## 2021-03-07 DIAGNOSIS — F1190 Opioid use, unspecified, uncomplicated: Principal | ICD-10-CM

## 2021-03-07 DIAGNOSIS — Z8619 Personal history of other infectious and parasitic diseases: Principal | ICD-10-CM

## 2021-03-07 DIAGNOSIS — F1910 Other psychoactive substance abuse, uncomplicated: Principal | ICD-10-CM

## 2021-03-07 DIAGNOSIS — R1030 Lower abdominal pain, unspecified: Principal | ICD-10-CM

## 2021-03-07 DIAGNOSIS — Z23 Encounter for immunization: Principal | ICD-10-CM

## 2021-03-07 DIAGNOSIS — R1011 Right upper quadrant pain: Principal | ICD-10-CM

## 2021-03-07 MED ORDER — SUBOXONE 8 MG-2 MG SUBLINGUAL FILM
ORAL_FILM | SUBLINGUAL | 1 refills | 0.00000 days | Status: CP
Start: 2021-03-07 — End: 2021-05-06

## 2021-03-15 DIAGNOSIS — F5104 Psychophysiologic insomnia: Principal | ICD-10-CM

## 2021-03-15 MED ORDER — HYDROXYZINE PAMOATE 25 MG CAPSULE
ORAL_CAPSULE | Freq: Every evening | ORAL | 11 refills | 30 days | Status: CP | PRN
Start: 2021-03-15 — End: 2021-03-15

## 2021-03-15 MED ORDER — HYDROXYZINE PAMOATE 50 MG CAPSULE
ORAL_CAPSULE | Freq: Every evening | ORAL | 11 refills | 30.00000 days | Status: CP
Start: 2021-03-15 — End: 2021-03-15

## 2021-04-01 DIAGNOSIS — M5441 Lumbago with sciatica, right side: Principal | ICD-10-CM

## 2021-04-01 DIAGNOSIS — M5442 Lumbago with sciatica, left side: Principal | ICD-10-CM

## 2021-04-01 DIAGNOSIS — G8929 Other chronic pain: Principal | ICD-10-CM

## 2021-04-01 DIAGNOSIS — M5126 Other intervertebral disc displacement, lumbar region: Principal | ICD-10-CM

## 2021-04-01 MED ORDER — GABAPENTIN 300 MG CAPSULE
ORAL_CAPSULE | Freq: Three times a day (TID) | ORAL | 0 refills | 30.00000 days
Start: 2021-04-01 — End: 2021-05-01

## 2021-04-02 MED ORDER — GABAPENTIN 300 MG CAPSULE
ORAL_CAPSULE | Freq: Three times a day (TID) | ORAL | 1 refills | 30.00000 days | Status: CP
Start: 2021-04-02 — End: 2021-05-02

## 2021-04-30 DIAGNOSIS — F1190 Opioid use, unspecified, uncomplicated: Principal | ICD-10-CM

## 2021-04-30 MED ORDER — SUBOXONE 8 MG-2 MG SUBLINGUAL FILM
ORAL_FILM | 1 refills | 0 days
Start: 2021-04-30 — End: 2021-06-29

## 2021-05-01 MED ORDER — SUBOXONE 8 MG-2 MG SUBLINGUAL FILM
ORAL_FILM | 1 refills | 0.00000 days
Start: 2021-05-01 — End: 2021-06-29

## 2021-05-05 MED ORDER — SUBOXONE 8 MG-2 MG SUBLINGUAL FILM
ORAL_FILM | 1 refills | 0 days
Start: 2021-05-05 — End: 2021-07-04

## 2021-05-05 MED ORDER — GABAPENTIN 300 MG CAPSULE
ORAL_CAPSULE | Freq: Three times a day (TID) | ORAL | 1 refills | 30.00000 days
Start: 2021-05-05 — End: 2021-06-04

## 2021-05-16 MED ORDER — SUBOXONE 8 MG-2 MG SUBLINGUAL FILM
ORAL_FILM | 1 refills | 0 days
Start: 2021-05-16 — End: 2021-07-15

## 2021-05-17 ENCOUNTER — Telehealth: Admit: 2021-05-17 | Discharge: 2021-05-18 | Payer: PRIVATE HEALTH INSURANCE

## 2021-05-17 DIAGNOSIS — F1190 Opioid use, unspecified, uncomplicated: Principal | ICD-10-CM

## 2021-05-17 DIAGNOSIS — K5903 Drug induced constipation: Principal | ICD-10-CM

## 2021-05-17 MED ORDER — SUBOXONE 8 MG-2 MG SUBLINGUAL FILM
ORAL_FILM | Freq: Four times a day (QID) | SUBLINGUAL | 1 refills | 30.00000 days | Status: CP
Start: 2021-05-17 — End: 2021-07-16

## 2021-05-20 ENCOUNTER — Ambulatory Visit: Admit: 2021-05-20 | Payer: PRIVATE HEALTH INSURANCE

## 2021-06-05 DIAGNOSIS — M5126 Other intervertebral disc displacement, lumbar region: Principal | ICD-10-CM

## 2021-06-05 DIAGNOSIS — G8929 Other chronic pain: Principal | ICD-10-CM

## 2021-06-05 DIAGNOSIS — M5441 Lumbago with sciatica, right side: Principal | ICD-10-CM

## 2021-06-05 DIAGNOSIS — M5442 Lumbago with sciatica, left side: Principal | ICD-10-CM

## 2021-06-05 MED ORDER — GABAPENTIN 300 MG CAPSULE
ORAL_CAPSULE | 0 refills | 0 days | Status: CP
Start: 2021-06-05 — End: ?

## 2021-06-13 DIAGNOSIS — R1011 Right upper quadrant pain: Principal | ICD-10-CM

## 2021-06-13 DIAGNOSIS — R109 Unspecified abdominal pain: Principal | ICD-10-CM

## 2021-06-17 DIAGNOSIS — F1190 Opioid use, unspecified, uncomplicated: Principal | ICD-10-CM

## 2021-06-26 ENCOUNTER — Institutional Professional Consult (permissible substitution): Admit: 2021-06-26 | Discharge: 2021-06-27 | Payer: PRIVATE HEALTH INSURANCE

## 2021-06-26 DIAGNOSIS — F1190 Opioid use, unspecified, uncomplicated: Principal | ICD-10-CM

## 2021-07-12 ENCOUNTER — Ambulatory Visit: Admit: 2021-07-12 | Discharge: 2021-07-13 | Payer: PRIVATE HEALTH INSURANCE

## 2021-07-13 MED ORDER — SUBOXONE 8 MG-2 MG SUBLINGUAL FILM
ORAL_FILM | Freq: Four times a day (QID) | SUBLINGUAL | 1 refills | 30 days
Start: 2021-07-13 — End: 2021-09-11

## 2021-07-13 MED ORDER — NALOXEGOL 25 MG TABLET
ORAL_TABLET | Freq: Every day | ORAL | 2 refills | 30 days
Start: 2021-07-13 — End: 2021-10-11

## 2021-07-15 ENCOUNTER — Telehealth: Admit: 2021-07-15 | Discharge: 2021-07-16 | Payer: PRIVATE HEALTH INSURANCE

## 2021-07-15 DIAGNOSIS — M5442 Lumbago with sciatica, left side: Principal | ICD-10-CM

## 2021-07-15 DIAGNOSIS — M5441 Lumbago with sciatica, right side: Principal | ICD-10-CM

## 2021-07-15 DIAGNOSIS — R110 Nausea: Principal | ICD-10-CM

## 2021-07-15 DIAGNOSIS — M5126 Other intervertebral disc displacement, lumbar region: Principal | ICD-10-CM

## 2021-07-15 DIAGNOSIS — R609 Edema, unspecified: Principal | ICD-10-CM

## 2021-07-15 DIAGNOSIS — T402X5A Adverse effect of other opioids, initial encounter: Principal | ICD-10-CM

## 2021-07-15 DIAGNOSIS — R1011 Right upper quadrant pain: Principal | ICD-10-CM

## 2021-07-15 DIAGNOSIS — F1190 Opioid use, unspecified, uncomplicated: Principal | ICD-10-CM

## 2021-07-15 DIAGNOSIS — G8929 Other chronic pain: Principal | ICD-10-CM

## 2021-07-15 DIAGNOSIS — K5903 Drug induced constipation: Principal | ICD-10-CM

## 2021-07-15 DIAGNOSIS — K219 Gastro-esophageal reflux disease without esophagitis: Principal | ICD-10-CM

## 2021-07-15 DIAGNOSIS — F1910 Other psychoactive substance abuse, uncomplicated: Principal | ICD-10-CM

## 2021-07-15 MED ORDER — SUBOXONE 8 MG-2 MG SUBLINGUAL FILM
ORAL_FILM | Freq: Four times a day (QID) | SUBLINGUAL | 0 refills | 30 days | Status: CP
Start: 2021-07-15 — End: 2021-08-14

## 2021-07-15 MED ORDER — NALOXEGOL 25 MG TABLET
ORAL_TABLET | Freq: Every day | ORAL | 2 refills | 30 days | Status: CP
Start: 2021-07-15 — End: 2021-10-13

## 2021-07-15 MED ORDER — GABAPENTIN 300 MG CAPSULE
ORAL_CAPSULE | Freq: Three times a day (TID) | ORAL | 11 refills | 30 days | Status: CP
Start: 2021-07-15 — End: 2022-07-15

## 2021-08-11 DIAGNOSIS — F1190 Opioid use, unspecified, uncomplicated: Principal | ICD-10-CM

## 2021-08-11 MED ORDER — SUBOXONE 8 MG-2 MG SUBLINGUAL FILM
0 refills | 0 days
Start: 2021-08-11 — End: ?

## 2021-08-12 MED ORDER — SUBOXONE 8 MG-2 MG SUBLINGUAL FILM
0 refills | 0 days | Status: CP
Start: 2021-08-12 — End: ?

## 2021-08-15 ENCOUNTER — Other Ambulatory Visit: Admit: 2021-08-15 | Discharge: 2021-08-16 | Payer: PRIVATE HEALTH INSURANCE

## 2021-08-15 DIAGNOSIS — F1190 Opioid use, unspecified, uncomplicated: Principal | ICD-10-CM

## 2021-09-07 MED ORDER — SUBOXONE 8 MG-2 MG SUBLINGUAL FILM
0 refills | 0 days
Start: 2021-09-07 — End: ?

## 2021-09-09 ENCOUNTER — Ambulatory Visit: Admit: 2021-09-09 | Discharge: 2021-09-10 | Payer: PRIVATE HEALTH INSURANCE

## 2021-09-09 DIAGNOSIS — K219 Gastro-esophageal reflux disease without esophagitis: Principal | ICD-10-CM

## 2021-09-09 DIAGNOSIS — R110 Nausea: Principal | ICD-10-CM

## 2021-09-09 DIAGNOSIS — F1190 Opioid use, unspecified, uncomplicated: Principal | ICD-10-CM

## 2021-09-09 MED ORDER — SUBOXONE 8 MG-2 MG SUBLINGUAL FILM
ORAL_FILM | Freq: Four times a day (QID) | SUBLINGUAL | 1 refills | 30 days | Status: CP
Start: 2021-09-09 — End: 2021-11-08

## 2021-09-09 MED ORDER — NALOXONE 4 MG/ACTUATION NASAL SPRAY
2 refills | 0 days | Status: CP
Start: 2021-09-09 — End: ?

## 2021-10-19 DIAGNOSIS — R110 Nausea: Principal | ICD-10-CM

## 2021-10-19 DIAGNOSIS — K219 Gastro-esophageal reflux disease without esophagitis: Principal | ICD-10-CM

## 2021-11-01 DIAGNOSIS — F1190 Opioid use, unspecified, uncomplicated: Principal | ICD-10-CM

## 2021-11-01 MED ORDER — SUBOXONE 8 MG-2 MG SUBLINGUAL FILM
ORAL_FILM | Freq: Four times a day (QID) | SUBLINGUAL | 1 refills | 30 days | Status: CP
Start: 2021-11-01 — End: 2021-12-31

## 2021-11-10 MED ORDER — SUBOXONE 8 MG-2 MG SUBLINGUAL FILM
0 refills | 0 days
Start: 2021-11-10 — End: ?

## 2021-11-11 ENCOUNTER — Ambulatory Visit: Admit: 2021-11-11 | Discharge: 2021-11-12 | Payer: PRIVATE HEALTH INSURANCE

## 2021-11-11 DIAGNOSIS — F1190 Opioid use, unspecified, uncomplicated: Principal | ICD-10-CM

## 2021-11-11 DIAGNOSIS — B356 Tinea cruris: Principal | ICD-10-CM

## 2021-11-11 DIAGNOSIS — R110 Nausea: Principal | ICD-10-CM

## 2021-11-11 DIAGNOSIS — S39012A Strain of muscle, fascia and tendon of lower back, initial encounter: Principal | ICD-10-CM

## 2021-11-11 MED ORDER — TIZANIDINE 4 MG TABLET
ORAL_TABLET | Freq: Four times a day (QID) | ORAL | 0 refills | 8 days | Status: CP | PRN
Start: 2021-11-11 — End: ?

## 2021-11-11 MED ORDER — NAFTIFINE 2 % TOPICAL CREAM
Freq: Every day | TOPICAL | 4 refills | 0 days | Status: CP
Start: 2021-11-11 — End: 2021-11-25

## 2021-11-27 DIAGNOSIS — J309 Allergic rhinitis, unspecified: Principal | ICD-10-CM

## 2021-11-27 MED ORDER — FLUTICASONE PROPIONATE 50 MCG/ACTUATION NASAL SPRAY,SUSPENSION
Freq: Every day | NASAL | 11 refills | 60 days | Status: CP
Start: 2021-11-27 — End: 2022-11-27

## 2021-11-30 MED ORDER — SUBOXONE 8 MG-2 MG SUBLINGUAL FILM
ORAL_FILM | Freq: Four times a day (QID) | SUBLINGUAL | 1 refills | 30 days | Status: CP
Start: 2021-11-30 — End: 2022-01-29

## 2021-12-12 ENCOUNTER — Ambulatory Visit
Admission: EM | Admit: 2021-12-12 | Discharge: 2021-12-12 | Disposition: A | Discharge status: Home / Self Care | Payer: Medicaid Other | Attending: Family Medicine | Admitting: Family Medicine

## 2021-12-12 ENCOUNTER — Encounter: Payer: Self-pay | Admitting: Emergency Medicine

## 2021-12-12 DIAGNOSIS — J00 Acute nasopharyngitis [common cold]: Secondary | ICD-10-CM | POA: Insufficient documentation

## 2021-12-12 DIAGNOSIS — H9203 Otalgia, bilateral: Secondary | ICD-10-CM | POA: Diagnosis not present

## 2021-12-12 DIAGNOSIS — R519 Headache, unspecified: Secondary | ICD-10-CM | POA: Insufficient documentation

## 2021-12-12 DIAGNOSIS — R0789 Other chest pain: Secondary | ICD-10-CM | POA: Insufficient documentation

## 2021-12-12 DIAGNOSIS — Z20822 Contact with and (suspected) exposure to covid-19: Secondary | ICD-10-CM | POA: Diagnosis not present

## 2021-12-12 DIAGNOSIS — F17210 Nicotine dependence, cigarettes, uncomplicated: Secondary | ICD-10-CM | POA: Diagnosis not present

## 2021-12-12 LAB — SARS CORONAVIRUS 2 BY RT PCR: SARS Coronavirus 2 by RT PCR: NEGATIVE

## 2021-12-12 NOTE — ED Triage Notes (Signed)
Pt reports chills, cough, body aches, headache, bilateral ear pain and nasal congestion since Tuesday. States this morning he woke up feeling like he had the flu. Reports taking home covid test this morning and it was positive. States the test was a year old so it may not be accurate.

## 2021-12-12 NOTE — ED Provider Notes (Signed)
[facility]    CSN: [id number] Arrival date & time: 12/12/21  0813      History   Chief Complaint Chief Complaint  Patient presents with   Cough   Chills   Headache   Nasal Congestion   Otalgia   Generalized Body Aches    HPI [name] is a 41 y.o. male.   HPI    Jared Trujillo presents for bilateral ear pain an headache that started on Tuesday.  Endorses chills, feeling feverish, myalgias.  He started to have a cough and has some chest tightness.  He smokes cigarettes.  Yesterday, he felt really cold and hot and laid back and went to sleep while talking with his wife.  He took "something for fever" this morning.  No documented fever.  Wife has no similar sx but they live in separate houses.  Home COVID test this morning was positive however he states it is about a-year-old.  He called his job this morning but they told him he needed to come in for a COVID test.   Fever : undocomented  Chills: yes Sore throat: no Cough: yes Sputum: no Nasal congestion : no  Rhinorrhea: yes, improving  Myalgias: yes Appetite: normal  Hydration: normal  Abdominal pain: yes Nausea: yes Vomiting: no Sleep disturbance: no Headache: yes     Past Medical History:  Diagnosis Date   History of drug abuse (HCC)     There are no problems to display for this patient.   Past Surgical History:  Procedure Laterality Date   MOUTH SURGERY     WRIST FRACTURE SURGERY Left        Home Medications    Prior to Admission medications   Medication Sig Start Date End Date Taking? Authorizing Provider  ARIPiprazole (ABILIFY) 10 MG tablet Take 10 mg by mouth daily.    [provider]  buprenorphine-naloxone (SUBOXONE) 8-2 mg SUBL SL tablet Place 1 tablet under the tongue daily.    [provider]  dicyclomine (BENTYL) 10 MG capsule Take 1 capsule (10 mg total) by mouth 3 (three) times daily as needed for up to 7 days for spasms. 02/14/20 02/21/20  Darci Current, MD  fluticasone (FLONASE) 50 MCG/ACT nasal spray Place 2 sprays into both nostrils daily. 08/29/19   Anson Oregon, PA-C  gabapentin (NEURONTIN) 300 MG capsule SMARTSIG:1-2 Capsule(s) By Mouth Every Evening 09/21/20   [provider]  methocarbamol (ROBAXIN) 500 MG tablet Take 1 tablet (500 mg total) by mouth every 6 (six) hours as needed. 03/29/19   Tommi Rumps, PA-C  methylPREDNISolone (MEDROL DOSEPAK) 4 MG TBPK tablet Take per package instructions. 08/29/19   Anson Oregon, PA-C  ondansetron (ZOFRAN ODT) 4 MG disintegrating tablet Take 1 tablet (4 mg total) by mouth every 8 (eight) hours as needed. 02/14/20   Darci Current, MD  ondansetron (ZOFRAN) 4 MG tablet Take 1 tablet (4 mg total) by mouth every 8 (eight) hours as needed for nausea or vomiting. 10/10/20   Eusebio Friendly B, PA-C  oxyCODONE (OXY IR/ROXICODONE) 5 MG immediate release tablet Take 1 tablet (5 mg total) by mouth every 6 (six) hours as needed for severe pain. 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      Physical Exam Triage Vital Signs ED Triage Vitals  Enc Vitals Group     BP 12/12/21 0825 131/88     Pulse Rate 12/12/21 0825 76     Resp 12/12/21 0825 18     Temp 12/12/21 0825 97.8 F (36.6 C)     Temp Source 12/12/21 0825 Oral     SpO2 12/12/21 0825 96  %     Weight --      Height --      Head Circumference --      Peak Flow --      Pain Score 12/12/21 0822 3     Pain Loc --      Pain Edu? --      Excl. in GC? --    No data found.  Updated Vital Signs BP 131/88 (BP Location: Left Arm)   Pulse 76   Temp 97.8 F (36.6 C) (Oral)   Resp 18   SpO2 96%   Visual Acuity Right Eye Distance:   Left Eye Distance:   Bilateral Distance:    Right Eye Near:   Left Eye Near:    Bilateral Near:     Physical Exam GEN:     alert, non-ill appearing male in no distress    HENT:  mucus membranes moist, oropharyngeal without lesions or exudate, no tonsillar hypertrophy, moderate erythema oropharyngeal, erythematous moderate turbinate hypertrophy, clear nasal discharge, bilateral TM normal EYES:   pupils equal and reactive, no scleral injection NECK:  normal ROM, no meningismus RESP:  clear to auscultation bilaterally, no increased work of breathing CVS:   regular rate and rhythm Skin:   warm and dry    UC Treatments / Results  Labs (all labs ordered are listed, but only abnormal results are displayed) Labs Reviewed  SARS CORONAVIRUS 2 BY RT PCR    EKG   Radiology No results found.  Procedures Procedures (including critical care time)  Medications Ordered in UC Medications - No data to display  Initial Impression / Assessment and Plan / UC Course  I have reviewed the triage vital signs and the nursing notes.  Pertinent labs & imaging results that were available during my care of the patient were reviewed by me and considered in my medical decision making (see chart for details).     History consistent with developing viral respiratory illness. Overall pt is well appearing, well hydrated, without respiratory distress. Discussed symptomatic treatment. COVID testing obtained and was negative.  - continue Tylenol/ Motrin as needed for discomfort/fever - nasal saline to help with his nasal congestion - Use a cool mist  humidifier at bedtime to help with breathing - Stressed hydration - Work/school note provided   - Discussed ED precautions, understanding voiced   Final Clinical Impressions(s) / UC Diagnoses   Final diagnoses:  Acute nasopharyngitis     Discharge Instructions      Your COVID  test is negative.  You likely have a viral upper respiratory infection.  You can take Tylenol and/or Ibuprofen as needed for fever reduction and pain relief.    For cough: honey 1/2 to 1 teaspoon (you can dilute the honey in water or another fluid).  You can also use guaifenesin and dextromethorphan for cough. You can use a humidifier for chest congestion and cough.  If you don't have a humidifier, you can sit in the bathroom with the hot shower running.      For sore throat: try warm salt water gargles, cepacol lozenges, throat spray, warm tea or water with lemon/honey, popsicles or ice, or OTC cold relief medicine for throat discomfort.    For congestion: take a daily anti-histamine like Zyrtec, Claritin, and a oral decongestant, such as pseudoephedrine.  You can also use Flonase 1-2 sprays in each nostril daily.    It is important to stay hydrated: drink plenty of fluids (water, gatorade/powerade/pedialyte, juices, or teas) to keep your throat moisturized and help further relieve irritation/discomfort.    Return or go to the Emergency Department if symptoms worsen or do not improve in the next few days      ED Prescriptions   None    PDMP not reviewed this encounter.   Katha Cabal, DO 12/12/21 1054

## 2021-12-12 NOTE — Discharge Instructions (Addendum)
Your COVID test is negative.  You likely have a viral upper respiratory infection.  You can take Tylenol and/or Ibuprofen as needed for fever reduction and pain relief.    For cough: honey 1/2 to 1 teaspoon (you can dilute the honey in water or another fluid).  You can also use guaifenesin and dextromethorphan for cough. You can use a humidifier for chest congestion and cough.  If you don't have a humidifier, you can sit in the bathroom with the hot shower running.      For sore throat: try warm salt water gargles, cepacol lozenges, throat spray, warm tea or water with lemon/honey, popsicles or ice, or OTC cold relief medicine for throat discomfort.    For congestion: take a daily anti-histamine like Zyrtec, Claritin, and a oral decongestant, such as pseudoephedrine.  You can also use Flonase 1-2 sprays in each nostril daily.    It is important to stay hydrated: drink plenty of fluids (water, gatorade/powerade/pedialyte, juices, or teas) to keep your throat moisturized and help further relieve irritation/discomfort.    Return or go to the Emergency Department if symptoms worsen or do not improve in the next few days

## 2022-01-09 DIAGNOSIS — K047 Periapical abscess without sinus: Principal | ICD-10-CM

## 2022-01-09 MED ORDER — AMOXICILLIN 875 MG TABLET
ORAL_TABLET | Freq: Two times a day (BID) | ORAL | 0 refills | 7 days | Status: CP
Start: 2022-01-09 — End: 2022-01-16

## 2022-01-09 MED ORDER — SUBOXONE 8 MG-2 MG SUBLINGUAL FILM
ORAL_FILM | Freq: Four times a day (QID) | SUBLINGUAL | 1 refills | 30 days
Start: 2022-01-09 — End: 2022-03-10

## 2022-01-10 ENCOUNTER — Ambulatory Visit: Admit: 2022-01-10 | Payer: PRIVATE HEALTH INSURANCE

## 2022-01-12 MED ORDER — SUBOXONE 8 MG-2 MG SUBLINGUAL FILM
ORAL_FILM | Freq: Four times a day (QID) | SUBLINGUAL | 1 refills | 30 days
Start: 2022-01-12 — End: 2022-03-13

## 2022-01-13 ENCOUNTER — Ambulatory Visit: Admit: 2022-01-13 | Discharge: 2022-01-14 | Payer: PRIVATE HEALTH INSURANCE

## 2022-01-13 DIAGNOSIS — F1190 Opioid use, unspecified, uncomplicated: Principal | ICD-10-CM

## 2022-01-13 DIAGNOSIS — F17200 Nicotine dependence, unspecified, uncomplicated: Principal | ICD-10-CM

## 2022-01-13 DIAGNOSIS — J392 Other diseases of pharynx: Principal | ICD-10-CM

## 2022-01-13 MED ORDER — SUBOXONE 8 MG-2 MG SUBLINGUAL FILM
ORAL_FILM | Freq: Four times a day (QID) | SUBLINGUAL | 1 refills | 30 days | Status: CP
Start: 2022-01-13 — End: 2022-03-14

## 2022-01-24 DIAGNOSIS — F1190 Opioid use, unspecified, uncomplicated: Principal | ICD-10-CM

## 2022-01-24 MED ORDER — SUBOXONE 8 MG-2 MG SUBLINGUAL FILM
ORAL_FILM | Freq: Four times a day (QID) | SUBLINGUAL | 1 refills | 30 days
Start: 2022-01-24 — End: 2022-03-25

## 2022-01-27 MED ORDER — SUBOXONE 8 MG-2 MG SUBLINGUAL FILM
ORAL_FILM | Freq: Four times a day (QID) | SUBLINGUAL | 1 refills | 30 days
Start: 2022-01-27 — End: 2022-03-28

## 2022-02-11 ENCOUNTER — Ambulatory Visit
Admit: 2022-02-11 | Discharge: 2022-02-12 | Payer: PRIVATE HEALTH INSURANCE | Attending: Internal Medicine | Primary: Internal Medicine

## 2022-02-11 DIAGNOSIS — R1084 Generalized abdominal pain: Principal | ICD-10-CM

## 2022-02-11 DIAGNOSIS — K219 Gastro-esophageal reflux disease without esophagitis: Principal | ICD-10-CM

## 2022-02-11 DIAGNOSIS — R110 Nausea: Principal | ICD-10-CM

## 2022-02-11 DIAGNOSIS — R194 Change in bowel habit: Principal | ICD-10-CM

## 2022-02-11 MED ORDER — PEG-ELECTROLYTE SOLUTION 420 GRAM ORAL SOLUTION
Freq: Once | ORAL | 0 refills | 1 days | Status: CP
Start: 2022-02-11 — End: 2022-02-11

## 2022-02-11 MED ORDER — ESOMEPRAZOLE MAGNESIUM 40 MG CAPSULE,DELAYED RELEASE
ORAL_CAPSULE | Freq: Every day | ORAL | 2 refills | 90 days | Status: CP
Start: 2022-02-11 — End: 2022-05-12

## 2022-03-23 MED ORDER — SUBOXONE 8 MG-2 MG SUBLINGUAL FILM
ORAL_FILM | Freq: Four times a day (QID) | SUBLINGUAL | 1 refills | 30 days
Start: 2022-03-23 — End: 2022-05-22

## 2022-03-24 MED ORDER — SUBOXONE 8 MG-2 MG SUBLINGUAL FILM
ORAL_FILM | Freq: Four times a day (QID) | SUBLINGUAL | 0 refills | 10 days | Status: CP
Start: 2022-03-24 — End: 2022-04-03

## 2022-03-30 MED ORDER — SUBOXONE 8 MG-2 MG SUBLINGUAL FILM
ORAL_FILM | Freq: Four times a day (QID) | SUBLINGUAL | 0 refills | 10 days
Start: 2022-03-30 — End: 2022-04-09

## 2022-03-31 ENCOUNTER — Ambulatory Visit: Admit: 2022-03-31 | Discharge: 2022-04-01 | Payer: PRIVATE HEALTH INSURANCE

## 2022-03-31 DIAGNOSIS — F1190 Opioid use, unspecified, uncomplicated: Principal | ICD-10-CM

## 2022-03-31 DIAGNOSIS — K5909 Other constipation: Principal | ICD-10-CM

## 2022-03-31 DIAGNOSIS — K5903 Drug induced constipation: Principal | ICD-10-CM

## 2022-03-31 MED ORDER — LINZESS 145 MCG CAPSULE
ORAL_CAPSULE | Freq: Every day | ORAL | 0 refills | 30 days | Status: CP
Start: 2022-03-31 — End: ?

## 2022-03-31 MED ORDER — SUBOXONE 8 MG-2 MG SUBLINGUAL FILM
ORAL_FILM | Freq: Four times a day (QID) | SUBLINGUAL | 1 refills | 30.00000 days | Status: CP
Start: 2022-03-31 — End: 2022-05-30

## 2022-04-30 DIAGNOSIS — F1190 Opioid use, unspecified, uncomplicated: Principal | ICD-10-CM

## 2022-04-30 MED ORDER — SUBOXONE 8 MG-2 MG SUBLINGUAL FILM
ORAL_FILM | Freq: Four times a day (QID) | SUBLINGUAL | 1 refills | 30 days | Status: CN
Start: 2022-04-30 — End: 2022-06-29

## 2022-05-13 MED ORDER — POLYETHYLENE GLYCOL 3350 17 GRAM/DOSE ORAL POWDER
0 refills | 0 days | Status: CP
Start: 2022-05-13 — End: ?

## 2022-05-13 MED ORDER — PEG 3350-ELECTROLYTES 236 GRAM-22.74 GRAM-6.74 GRAM-5.86 GRAM SOLUTION
Freq: Once | ORAL | 0 refills | 1 days | Status: CP
Start: 2022-05-13 — End: 2022-05-13

## 2022-05-13 MED ORDER — BISACODYL 5 MG TABLET,DELAYED RELEASE
ORAL_TABLET | ORAL | 0 refills | 0 days | Status: CP
Start: 2022-05-13 — End: ?

## 2022-05-28 MED ORDER — SUBOXONE 8 MG-2 MG SUBLINGUAL FILM
ORAL_FILM | Freq: Four times a day (QID) | SUBLINGUAL | 1 refills | 30 days
Start: 2022-05-28 — End: 2022-07-27

## 2022-05-29 ENCOUNTER — Ambulatory Visit: Admit: 2022-05-29 | Discharge: 2022-05-30 | Payer: PRIVATE HEALTH INSURANCE

## 2022-05-29 DIAGNOSIS — M546 Pain in thoracic spine: Principal | ICD-10-CM

## 2022-05-29 DIAGNOSIS — G8929 Other chronic pain: Principal | ICD-10-CM

## 2022-05-29 DIAGNOSIS — F1190 Opioid use, unspecified, uncomplicated: Principal | ICD-10-CM

## 2022-05-29 DIAGNOSIS — K5903 Drug induced constipation: Principal | ICD-10-CM

## 2022-05-29 MED ORDER — SUBOXONE 8 MG-2 MG SUBLINGUAL FILM
ORAL_FILM | Freq: Four times a day (QID) | SUBLINGUAL | 1 refills | 30 days | Status: CP
Start: 2022-05-29 — End: 2022-07-28

## 2022-06-16 ENCOUNTER — Ambulatory Visit: Admit: 2022-06-16 | Discharge: 2022-06-16 | Payer: PRIVATE HEALTH INSURANCE

## 2022-06-16 ENCOUNTER — Encounter
Admit: 2022-06-16 | Discharge: 2022-06-16 | Payer: PRIVATE HEALTH INSURANCE | Attending: Student in an Organized Health Care Education/Training Program | Primary: Student in an Organized Health Care Education/Training Program

## 2022-07-24 DIAGNOSIS — F1190 Opioid use, unspecified, uncomplicated: Principal | ICD-10-CM

## 2022-07-24 MED ORDER — SUBOXONE 8 MG-2 MG SUBLINGUAL FILM
ORAL_FILM | Freq: Four times a day (QID) | SUBLINGUAL | 0 refills | 6.00000 days | Status: CP
Start: 2022-07-24 — End: 2022-07-30

## 2022-07-25 MED ORDER — SUBOXONE 8 MG-2 MG SUBLINGUAL FILM
ORAL_FILM | Freq: Four times a day (QID) | SUBLINGUAL | 1 refills | 30 days
Start: 2022-07-25 — End: 2022-09-23

## 2022-07-26 MED ORDER — SUBOXONE 8 MG-2 MG SUBLINGUAL FILM
ORAL_FILM | Freq: Four times a day (QID) | SUBLINGUAL | 0 refills | 6 days
Start: 2022-07-26 — End: 2022-08-01

## 2022-07-28 DIAGNOSIS — G8929 Other chronic pain: Principal | ICD-10-CM

## 2022-07-28 DIAGNOSIS — M5126 Other intervertebral disc displacement, lumbar region: Principal | ICD-10-CM

## 2022-07-28 DIAGNOSIS — M5441 Lumbago with sciatica, right side: Principal | ICD-10-CM

## 2022-07-28 DIAGNOSIS — M5442 Lumbago with sciatica, left side: Principal | ICD-10-CM

## 2022-07-28 MED ORDER — GABAPENTIN 300 MG CAPSULE
ORAL_CAPSULE | Freq: Three times a day (TID) | ORAL | 0 refills | 0 days
Start: 2022-07-28 — End: ?

## 2022-07-29 MED ORDER — GABAPENTIN 300 MG CAPSULE
ORAL_CAPSULE | Freq: Three times a day (TID) | ORAL | 0 refills | 30 days | Status: CP
Start: 2022-07-29 — End: ?

## 2022-07-30 ENCOUNTER — Ambulatory Visit: Admit: 2022-07-30 | Discharge: 2022-07-31 | Payer: PRIVATE HEALTH INSURANCE

## 2022-07-30 DIAGNOSIS — H539 Unspecified visual disturbance: Principal | ICD-10-CM

## 2022-07-30 DIAGNOSIS — J309 Allergic rhinitis, unspecified: Principal | ICD-10-CM

## 2022-07-30 DIAGNOSIS — Z3009 Encounter for other general counseling and advice on contraception: Principal | ICD-10-CM

## 2022-07-30 DIAGNOSIS — F1190 Opioid use, unspecified, uncomplicated: Principal | ICD-10-CM

## 2022-07-30 DIAGNOSIS — H43399 Other vitreous opacities, unspecified eye: Principal | ICD-10-CM

## 2022-07-30 DIAGNOSIS — B356 Tinea cruris: Principal | ICD-10-CM

## 2022-07-30 MED ORDER — NALOXONE 4 MG/ACTUATION NASAL SPRAY
0 refills | 0 days | Status: CP
Start: 2022-07-30 — End: ?

## 2022-07-30 MED ORDER — SUBOXONE 8 MG-2 MG SUBLINGUAL FILM
ORAL_FILM | Freq: Four times a day (QID) | SUBLINGUAL | 1 refills | 30 days | Status: CP
Start: 2022-07-30 — End: 2022-09-28

## 2022-07-30 MED ORDER — NAFTIFINE 2 % TOPICAL CREAM
Freq: Every day | TOPICAL | 4 refills | 0 days | Status: CP
Start: 2022-07-30 — End: 2022-08-13

## 2022-07-30 MED ORDER — FLUTICASONE PROPIONATE 50 MCG/ACTUATION NASAL SPRAY,SUSPENSION
Freq: Every day | NASAL | 11 refills | 60 days | Status: CP
Start: 2022-07-30 — End: 2023-07-30

## 2022-08-26 DIAGNOSIS — M5442 Lumbago with sciatica, left side: Principal | ICD-10-CM

## 2022-08-26 DIAGNOSIS — M5126 Other intervertebral disc displacement, lumbar region: Principal | ICD-10-CM

## 2022-08-26 DIAGNOSIS — G8929 Other chronic pain: Principal | ICD-10-CM

## 2022-08-26 DIAGNOSIS — M5441 Lumbago with sciatica, right side: Principal | ICD-10-CM

## 2022-08-26 MED ORDER — GABAPENTIN 300 MG CAPSULE
ORAL_CAPSULE | Freq: Three times a day (TID) | ORAL | 0 refills | 0 days
Start: 2022-08-26 — End: ?

## 2022-08-27 MED ORDER — GABAPENTIN 300 MG CAPSULE
ORAL_CAPSULE | Freq: Three times a day (TID) | ORAL | 0 refills | 30 days | Status: CP
Start: 2022-08-27 — End: ?

## 2022-09-18 ENCOUNTER — Ambulatory Visit
Admission: EM | Admit: 2022-09-18 | Discharge: 2022-09-18 | Disposition: A | Discharge status: Home / Self Care | Payer: Medicaid Other | Attending: Family Medicine | Admitting: Family Medicine

## 2022-09-18 ENCOUNTER — Ambulatory Visit (INDEPENDENT_AMBULATORY_CARE_PROVIDER_SITE_OTHER): Payer: Medicaid Other

## 2022-09-18 DIAGNOSIS — Z1152 Encounter for screening for COVID-19: Secondary | ICD-10-CM | POA: Diagnosis not present

## 2022-09-18 DIAGNOSIS — J988 Other specified respiratory disorders: Secondary | ICD-10-CM | POA: Insufficient documentation

## 2022-09-18 DIAGNOSIS — B9789 Other viral agents as the cause of diseases classified elsewhere: Secondary | ICD-10-CM | POA: Insufficient documentation

## 2022-09-18 LAB — GROUP A STREP BY PCR: Group A Strep by PCR: NOT DETECTED

## 2022-09-18 LAB — SARS CORONAVIRUS 2 BY RT PCR: SARS Coronavirus 2 by RT PCR: NEGATIVE

## 2022-09-18 MED ORDER — PROMETHAZINE-DM 6.25-15 MG/5ML PO SYRP: 5.0000 mL | ORAL_SOLUTION | Freq: Four times a day (QID) | ORAL | 0 refills | Status: DC | PRN

## 2022-09-18 MED ORDER — ALBUTEROL SULFATE HFA 108 (90 BASE) MCG/ACT IN AERS: 2.0000 | INHALATION_SPRAY | RESPIRATORY_TRACT | 0 refills | Status: AC | PRN

## 2022-09-18 NOTE — ED Provider Notes (Signed)
[facility]    CSN: [id number] Arrival date & time: 09/18/22  0910      History   Chief Complaint Chief Complaint  Patient presents with   Cough   Fever   Generalized Body Aches    HPI Jared Trujillo is a 42 y.o. male.   HPI  History History obtained from the patient. Duffy presents for cough, fever, body aches, chills, ear pain and sore throat that started on Monday.  His son has similar sx but his has resolved. Took DayQuil with short term relief. Needed to leave work this morning due to his symptoms.    Fever : undocumented Chills: yes Sore throat: yes Cough: yes Sputum: no Chest tightness: no Shortness of breath: yes Wheezing: yes at night Nasal congestion : yes Rhinorrhea: yes Myalgias: yes Appetite: decreased Hydration: normal  Abdominal pain: no Nausea: yes  Vomiting: no Diarrhea: No Rash: No Sleep disturbance: yes Headache: yes     Past Medical History:  Diagnosis Date   History of drug abuse (HCC)     There are no problems to display for this patient.   Past Surgical History:  Procedure Laterality Date   MOUTH SURGERY     WRIST FRACTURE SURGERY Left        Home Medications    Prior to Admission medications   Medication Sig Start Date End Date Taking? Authorizing Provider  albuterol (VENTOLIN HFA) 108 (90 Base) MCG/ACT inhaler Inhale 2 puffs into the lungs every 4 (four) hours as needed. 09/18/22  Yes Jashley Yellin, DO  buprenorphine-naloxone (SUBOXONE) 8-2 mg SUBL SL tablet Place 1 tablet under the tongue daily.   Yes [provider]  esomeprazole (NEXIUM) 40 MG capsule Take 40 mg by mouth every morning. 08/06/22  Yes [provider]  fluticasone (FLONASE) 50 MCG/ACT nasal spray Place 2 sprays into both nostrils daily. 08/29/19  Yes Anson Oregon, PA-C  gabapentin (NEURONTIN) 300 MG capsule SMARTSIG:1-2 Capsule(s) By Mouth Every Evening 09/21/20  Yes [provider]   promethazine-dextromethorphan (PROMETHAZINE-DM) 6.25-15 MG/5ML syrup Take 5 mLs by mouth 4 (four) times daily as needed. 09/18/22  Yes Latrisha Coiro, DO  ARIPiprazole (ABILIFY) 10 MG tablet Take 10 mg by mouth daily.    [provider]  dicyclomine (BENTYL) 10 MG capsule Take 1 capsule (10 mg total) by mouth 3 (three) times daily as needed for up to 7 days for spasms. 02/14/20 02/21/20  Darci Current, MD    Family History Family History  Problem Relation Age of Onset   Congestive Heart Failure Mother    COPD Mother    Healthy Father     Social History Social History   Tobacco Use   Smoking status: Every Day    Packs/day: 1.00    Years: 20.00    Additional pack years: 0.00    Total pack years: 20.00    Types: Cigarettes   Smokeless tobacco: Current  Vaping Use   Vaping Use: Every day   Substances: Nicotine  Substance Use Topics   Alcohol use: Not Currently   Drug use: Not Currently    Types: IV    Comment: last heroin use 6 months ago     Allergies   Acetaminophen   Review of Systems Review of Systems: negative unless otherwise stated in HPI.      Physical Exam Triage Vital Signs ED Triage Vitals  Enc Vitals Group     BP 09/18/22 0918 129/87     Pulse  Rate 09/18/22 0918 (!) 102     Resp 09/18/22 0918 16     Temp 09/18/22 0918 99.9 F (37.7 C)     Temp Source 09/18/22 0918 Oral     SpO2 09/18/22 0918 94 %     Weight 09/18/22 0918 165 lb (74.8 kg)     Height 09/18/22 0918 5\' 9"  (1.753 m)     Head Circumference --      Peak Flow --      Pain Score 09/18/22 0922 7     Pain Loc --      Pain Edu? --      Excl. in GC? --    No data found.  Updated Vital Signs BP 129/87 (BP Location: Right Arm)   Pulse (!) 102   Temp 99.9 F (37.7 C) (Oral)   Resp 16   Ht 5\' 9"  (1.753 m)   Wt 74.8 kg   SpO2 94%   BMI 24.37 kg/m   Visual Acuity Right Eye Distance:   Left Eye Distance:   Bilateral Distance:    Right Eye Near:   Left Eye Near:     Bilateral Near:     Physical Exam GEN:     alert, non-toxic appearing male in no distress    HENT:  mucus membranes moist, oropharyngeal without lesions, mild erythema, no tonsillar hypertrophy or exudates,  moderate erythematous edematous turbinates, clear nasal discharge, bilateral TM effusions, no erythema  EYES:   pupils equal and reactive, no scleral injection or discharge NECK:  normal ROM, no lymphadenopathy, no meningismus   RESP:  no increased work of breathing, left upper lung coarse breathe sounds, no wheezing  CVS:   regular  rhythm, tachycardic Skin:   warm and dry, no rash on visible skin    UC Treatments / Results  Labs (all labs ordered are listed, but only abnormal results are displayed) Labs Reviewed  SARS CORONAVIRUS 2 BY RT PCR  GROUP A STREP BY PCR    EKG   Radiology DG Chest 2 View  Result Date: 09/18/2022 CLINICAL DATA:  Cough, fever, shortness of breath for 4 days. EXAM: CHEST - 2 VIEW COMPARISON:  AP chest 03/21/2011 FINDINGS: Cardiac silhouette and mediastinal contours are within normal limits. The lungs are clear. No pleural effusion or pneumothorax. No acute skeletal abnormality. IMPRESSION: No active cardiopulmonary disease. Electronically Signed   By: Neita Garnet M.D.   On: 09/18/2022 09:59    Procedures Procedures (including critical care time)  Medications Ordered in UC Medications - No data to display  Initial Impression / Assessment and Plan / UC Course  I have reviewed the triage vital signs and the nursing notes.  Pertinent labs & imaging results that were available during my care of the patient were reviewed by me and considered in my medical decision making (see chart for details).       Pt is a 42 y.o. male who presents for 4 days of respiratory symptoms. [name] is taqchyacardic with elevated temperature (99.9 F).  Satting well on room air. Overall pt is ill but non-toxic appearing, well hydrated, without respiratory distress.  Pulmonary exam is unremarkable.  COVID testing obtained and was negative. Strep PCR is negative.  Chest xray personally reviewed by me without focal pneumonia, pleural effusion, cardiomegaly or pneumothorax.   History consistent with viral respiratory illness. Discussed symptomatic treatment.  Explained lack of efficacy of antibiotics in viral disease.  Typical duration of symptoms discussed. Promethazine DM for cough. Albuterol  for shortness of breath.   Return and ED precautions given and voiced understanding. Discussed MDM, treatment plan and plan for follow-up with patient who agrees with plan.     Final Clinical Impressions(s) / UC Diagnoses   Final diagnoses:  Viral respiratory illness     Discharge Instructions      Your strep and COVID tests are negative. Your chest xray did not show pneumonia or bronchitis. You likely have a viral respiratory infection.  Stop by the pharmacy to pick up your cough syrup and albuterol inhaler.  Follow up with your primary care provider as needed.      ED Prescriptions     Medication Sig Dispense Auth. Provider   promethazine-dextromethorphan (PROMETHAZINE-DM) 6.25-15 MG/5ML syrup Take 5 mLs by mouth 4 (four) times daily as needed. 118 mL Kandie Keiper, DO   albuterol (VENTOLIN HFA) 108 (90 Base) MCG/ACT inhaler Inhale 2 puffs into the lungs every 4 (four) hours as needed. 6.7 g Katha Cabal, DO      PDMP not reviewed this encounter.   Katha Cabal, DO 09/18/22 1030

## 2022-09-18 NOTE — Discharge Instructions (Addendum)
Your strep and COVID tests are negative. Your chest xray did not show pneumonia or bronchitis. You likely have a viral respiratory infection.  Stop by the pharmacy to pick up your cough syrup and albuterol inhaler.  Follow up with your primary care provider as needed.

## 2022-09-18 NOTE — ED Triage Notes (Signed)
Pt c/o bodyaches, cough & fever x4 days. Has tried dayquil w/o relief.

## 2022-09-22 DIAGNOSIS — F1190 Opioid use, unspecified, uncomplicated: Principal | ICD-10-CM

## 2022-09-25 MED ORDER — SUBOXONE 8 MG-2 MG SUBLINGUAL FILM
ORAL_FILM | Freq: Four times a day (QID) | SUBLINGUAL | 0 refills | 11 days | Status: CP
Start: 2022-09-25 — End: 2022-10-06

## 2022-10-06 ENCOUNTER — Ambulatory Visit: Admit: 2022-10-06 | Discharge: 2022-10-07 | Payer: PRIVATE HEALTH INSURANCE

## 2022-10-06 MED ORDER — SUBOXONE 8 MG-2 MG SUBLINGUAL FILM
ORAL_FILM | Freq: Four times a day (QID) | SUBLINGUAL | 2 refills | 30 days | Status: CP
Start: 2022-10-06 — End: 2023-01-04

## 2022-11-03 DIAGNOSIS — G8929 Other chronic pain: Principal | ICD-10-CM

## 2022-11-03 DIAGNOSIS — M5441 Lumbago with sciatica, right side: Principal | ICD-10-CM

## 2022-11-03 DIAGNOSIS — M5126 Other intervertebral disc displacement, lumbar region: Principal | ICD-10-CM

## 2022-11-03 DIAGNOSIS — M5442 Lumbago with sciatica, left side: Principal | ICD-10-CM

## 2022-11-03 MED ORDER — ESOMEPRAZOLE MAGNESIUM 40 MG CAPSULE,DELAYED RELEASE
ORAL_CAPSULE | 0 refills | 0 days | Status: CP
Start: 2022-11-03 — End: ?

## 2022-11-03 MED ORDER — GABAPENTIN 300 MG CAPSULE
ORAL_CAPSULE | Freq: Three times a day (TID) | ORAL | 5 refills | 30 days | Status: CP
Start: 2022-11-03 — End: ?

## 2022-11-25 ENCOUNTER — Ambulatory Visit
Admission: EM | Admit: 2022-11-25 | Discharge: 2022-11-25 | Disposition: A | Discharge status: Home / Self Care | Payer: Medicaid Other | Attending: Physician Assistant | Admitting: Physician Assistant

## 2022-11-25 DIAGNOSIS — B349 Viral infection, unspecified: Secondary | ICD-10-CM | POA: Diagnosis present

## 2022-11-25 DIAGNOSIS — R5383 Other fatigue: Secondary | ICD-10-CM | POA: Diagnosis present

## 2022-11-25 DIAGNOSIS — J029 Acute pharyngitis, unspecified: Secondary | ICD-10-CM | POA: Insufficient documentation

## 2022-11-25 DIAGNOSIS — Z1152 Encounter for screening for COVID-19: Secondary | ICD-10-CM | POA: Insufficient documentation

## 2022-11-25 LAB — SARS CORONAVIRUS 2 BY RT PCR: SARS Coronavirus 2 by RT PCR: NEGATIVE

## 2022-11-25 LAB — GROUP A STREP BY PCR: Group A Strep by PCR: NOT DETECTED

## 2022-11-25 NOTE — ED Provider Notes (Signed)
[facility]    CSN: [id number] Arrival date & time: 11/25/22  1821      History   Chief Complaint Chief Complaint  Patient presents with   Generalized Body Aches   Fatigue   Chills    HPI Jared Trujillo is a 42 y.o. male presenting for approximately 2-day history of fatigue, body aches, chills, sweats, sore throat. He also admits to bilateral ear pressure and sinus pressure.  Has had some minor congestion as well.  Patient says that his children has similar symptoms and were recently diagnosed with ear infections.  Has been taking Tylenol for suspected fever.  He has not had a cough, chest discomfort, or breathing difficulty.  No other complaints or concerns.  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 Authorizing Provider  albuterol (VENTOLIN HFA) 108 (90 Base) MCG/ACT inhaler Inhale 2 puffs into the lungs every 4 (four) hours as needed. 09/18/22  Yes Brimage, Vondra, DO  ARIPiprazole (ABILIFY)  10 MG tablet Take 10 mg by mouth daily.   Yes [provider]  buprenorphine-naloxone (SUBOXONE) 8-2 mg SUBL SL tablet Place 1 tablet under the tongue daily.   Yes [provider]  dicyclomine (BENTYL) 10 MG capsule Take 1 capsule (10 mg total) by mouth 3 (three) times daily as needed for up to 7 days for spasms. 02/14/20 11/25/22 Yes Darci Current, MD  esomeprazole (NEXIUM) 40 MG capsule Take 40 mg by mouth every morning. 08/06/22  Yes [provider]  gabapentin (NEURONTIN) 300 MG capsule SMARTSIG:1-2 Capsule(s) By Mouth Every Evening 09/21/20  Yes [provider]  fluticasone (FLONASE) 50 MCG/ACT nasal spray Place 2 sprays into both nostrils daily. 08/29/19   Anson Oregon, PA-C  promethazine-dextromethorphan (PROMETHAZINE-DM) 6.25-15 MG/5ML syrup Take 5 mLs by mouth 4 (four) times daily as needed. 09/18/22   Katha Cabal, DO    Family History Family History  Problem Relation Age of Onset   Congestive Heart Failure Mother    COPD Mother    Healthy Father     Social History Social History   Tobacco Use   Smoking status: Every Day    Current packs/day: 1.00    Average packs/day: 1 pack/day for 20.0 years (20.0 ttl pk-yrs)    Types: Cigarettes   Smokeless tobacco: Current  Vaping Use   Vaping status: Every Day   Substances: Nicotine  Substance Use Topics   Alcohol use: Not Currently  Drug use: Not Currently    Types: IV    Comment: last heroin use 6 months ago     Allergies   Acetaminophen   Review of Systems Review of Systems  Constitutional:  Positive for chills, diaphoresis and fatigue. Negative for fever.  HENT:  Positive for congestion, ear pain, rhinorrhea, sinus pressure and sore throat.   Respiratory:  Negative for cough and shortness of breath.   Cardiovascular:  Negative for chest pain.  Gastrointestinal:  Negative for abdominal pain, diarrhea, nausea and vomiting.  Musculoskeletal:  Negative for myalgias.   Neurological:  Negative for weakness, light-headedness and headaches.  Hematological:  Negative for adenopathy.     Physical Exam Triage Vital Signs ED Triage Vitals [10/10/20 1917]  Enc Vitals Group     BP 113/79     Pulse Rate (!) 112     Resp 18     Temp 98.5 F (36.9 C)     Temp Source Oral     SpO2 98 %     Weight 145 lb 1 oz (65.8 kg)     Height 5\' 9"  (1.753 m)     Head Circumference      Peak Flow      Pain Score 2     Pain Loc      Pain Edu?      Excl. in GC?    No data found.  Updated Vital Signs BP 131/88 (BP Location: Right Arm)   Pulse (!) 103   Temp 98.3 F (36.8 C) (Oral)   Resp 16   Ht 5\' 9"  (1.753 m)   Wt 165 lb (74.8 kg)   SpO2 95%   BMI 24.37 kg/m       Physical Exam Vitals and nursing note reviewed.  Constitutional:      General: He is not in acute distress.    Appearance: Normal appearance. He is well-developed. He is ill-appearing. He is not diaphoretic.  HENT:     Head: Normocephalic and atraumatic.     Right Ear: Tympanic membrane, ear canal and external ear normal.     Left Ear: Tympanic membrane, ear canal and external ear normal.     Nose: Congestion present. No rhinorrhea.     Mouth/Throat:     Mouth: Mucous membranes are moist.     Pharynx: Oropharynx is clear. Uvula midline. Posterior oropharyngeal erythema present.     Tonsils: No tonsillar abscesses.  Eyes:     General: No scleral icterus.       Right eye: No discharge.        Left eye: No discharge.     Conjunctiva/sclera: Conjunctivae normal.  Neck:     Thyroid: No thyromegaly.     Trachea: No tracheal deviation.  Cardiovascular:     Rate and Rhythm: Regular rhythm. Tachycardia present.     Heart sounds: Normal heart sounds.  Pulmonary:     Effort: Pulmonary effort is normal. No respiratory distress.     Breath sounds: Normal breath sounds. No wheezing, rhonchi or rales.  Abdominal:     Palpations: Abdomen is soft.     Tenderness: There is no abdominal  tenderness.  Musculoskeletal:     Cervical back: Neck supple.  Lymphadenopathy:     Cervical: No cervical adenopathy.  Skin:    General: Skin is warm and dry.     Findings: No rash.  Neurological:     General: No focal deficit present.     Mental Status: He  is alert. Mental status is at baseline.     Motor: No weakness.     Gait: Gait normal.  Psychiatric:        Mood and Affect: Mood normal.        Behavior: Behavior normal.        Thought Content: Thought content normal.      UC Treatments / Results  Labs (all labs ordered are listed, but only abnormal results are displayed) Labs Reviewed  SARS CORONAVIRUS 2 BY RT PCR  GROUP A STREP BY PCR    EKG   Radiology No results found.  Procedures Procedures (including critical care time)  Medications Ordered in UC Medications - No data to display  Initial Impression / Assessment and Plan / UC Course  I have reviewed the triage vital signs and the nursing notes.  Pertinent labs & imaging results that were available during my care of the patient were reviewed by me and considered in my medical decision making (see chart for details).   42 y/o male presenting for 2 day history of fatigue, sweats, sore throat, body aches bilateral ear pressure and congestion.  Children have had similar symptoms.    Patient's vital signs are stable clinic today.  He is mildly tachycardic at 103 bpm.  He is ill-appearing, but nontoxic.  He has minor congestion and moderate posterior pharyngeal erythema.  No evidence of ear infection.  Chest clear to auscultation heart regular rhythm.  No abdominal tenderness.    Strep and COVID testing obtained.  All negative.  Advised him he has a viral illness.  Advised to increase rest and fluids.  Advised use of OTC meds and provided with a work note.  Advised to follow-up with our department if not feeling better in the next week or for any worsening symptoms.  ED precautions reviewed.  Final Clinical  Impressions(s) / UC Diagnoses   Final diagnoses:  Viral illness  Other fatigue  Sore throat     Discharge Instructions      URI/COLD SYMPTOMS: Your exam today is consistent with a viral illness. Antibiotics are not indicated at this time. Use medications as directed, including cough syrup, nasal saline, and decongestants. Your symptoms should improve over the next few days and resolve within 7-10 days. Increase rest and fluids. F/u if symptoms worsen or predominate such as sore throat, ear pain, productive cough, shortness of breath, or if you develop high fevers or worsening fatigue over the next several days.        ED Prescriptions   None    PDMP not reviewed this encounter.      Shirlee Latch, PA-C 11/25/22 1931

## 2022-11-25 NOTE — Discharge Instructions (Signed)

## 2022-11-25 NOTE — ED Triage Notes (Signed)
Pt c/o bodyaches,fatigue & chills x1 day. Has tried tylenol w/o relief.

## 2023-01-07 MED ORDER — SUBOXONE 8 MG-2 MG SUBLINGUAL FILM
ORAL_FILM | Freq: Four times a day (QID) | SUBLINGUAL | 2 refills | 30 days
Start: 2023-01-07 — End: 2023-04-07

## 2023-01-08 ENCOUNTER — Ambulatory Visit: Admit: 2023-01-08 | Payer: PRIVATE HEALTH INSURANCE

## 2023-01-08 DIAGNOSIS — F1190 Opioid use, unspecified, uncomplicated: Principal | ICD-10-CM

## 2023-01-08 MED ORDER — SUBOXONE 8 MG-2 MG SUBLINGUAL FILM
0 refills | 0 days
Start: 2023-01-08 — End: ?

## 2023-01-09 MED ORDER — SUBOXONE 8 MG-2 MG SUBLINGUAL FILM
ORAL_FILM | Freq: Four times a day (QID) | SUBLINGUAL | 0 refills | 13 days | Status: CP
Start: 2023-01-09 — End: 2023-01-22

## 2023-01-21 MED ORDER — SUBOXONE 8 MG-2 MG SUBLINGUAL FILM
ORAL_FILM | Freq: Four times a day (QID) | SUBLINGUAL | 0 refills | 13 days
Start: 2023-01-21 — End: 2023-02-03

## 2023-01-22 ENCOUNTER — Ambulatory Visit: Admit: 2023-01-22 | Discharge: 2023-01-23 | Payer: PRIVATE HEALTH INSURANCE

## 2023-01-22 DIAGNOSIS — F17200 Nicotine dependence, unspecified, uncomplicated: Principal | ICD-10-CM

## 2023-01-22 DIAGNOSIS — Z23 Encounter for immunization: Principal | ICD-10-CM

## 2023-01-22 DIAGNOSIS — R5383 Other fatigue: Principal | ICD-10-CM

## 2023-01-22 DIAGNOSIS — F1190 Opioid use, unspecified, uncomplicated: Principal | ICD-10-CM

## 2023-01-22 DIAGNOSIS — Z8709 Personal history of other diseases of the respiratory system: Principal | ICD-10-CM

## 2023-01-22 DIAGNOSIS — R632 Polyphagia: Principal | ICD-10-CM

## 2023-01-22 MED ORDER — SUBOXONE 8 MG-2 MG SUBLINGUAL FILM
ORAL_FILM | Freq: Four times a day (QID) | SUBLINGUAL | 2 refills | 30 days | Status: CP
Start: 2023-01-22 — End: 2023-04-22

## 2023-02-03 MED ORDER — ESOMEPRAZOLE MAGNESIUM 40 MG CAPSULE,DELAYED RELEASE
ORAL_CAPSULE | 0 refills | 0 days
Start: 2023-02-03 — End: ?

## 2023-02-04 MED ORDER — ESOMEPRAZOLE MAGNESIUM 40 MG CAPSULE,DELAYED RELEASE
ORAL_CAPSULE | 2 refills | 0 days | Status: CP
Start: 2023-02-04 — End: ?

## 2023-02-15 ENCOUNTER — Encounter: Payer: Self-pay | Admitting: Emergency Medicine

## 2023-02-15 ENCOUNTER — Ambulatory Visit (INDEPENDENT_AMBULATORY_CARE_PROVIDER_SITE_OTHER): Payer: Medicaid Other

## 2023-02-15 ENCOUNTER — Ambulatory Visit
Admission: EM | Admit: 2023-02-15 | Discharge: 2023-02-15 | Disposition: A | Discharge status: Home / Self Care | Payer: Medicaid Other | Attending: Family Medicine | Admitting: Family Medicine

## 2023-02-15 DIAGNOSIS — R059 Cough, unspecified: Secondary | ICD-10-CM

## 2023-02-15 DIAGNOSIS — J209 Acute bronchitis, unspecified: Secondary | ICD-10-CM

## 2023-02-15 DIAGNOSIS — F17210 Nicotine dependence, cigarettes, uncomplicated: Secondary | ICD-10-CM | POA: Diagnosis not present

## 2023-02-15 DIAGNOSIS — R052 Subacute cough: Secondary | ICD-10-CM

## 2023-02-15 MED ORDER — BENZONATATE 100 MG PO CAPS: 100.0000 mg | ORAL_CAPSULE | Freq: Three times a day (TID) | ORAL | 0 refills | Status: AC

## 2023-02-15 MED ORDER — PREDNISONE 10 MG (21) PO TBPK: ORAL_TABLET | Freq: Every day | ORAL | 0 refills | Status: AC

## 2023-02-15 MED ORDER — PROMETHAZINE-DM 6.25-15 MG/5ML PO SYRP: 5.0000 mL | ORAL_SOLUTION | Freq: Four times a day (QID) | ORAL | 0 refills | Status: AC | PRN

## 2023-02-15 MED ORDER — AZITHROMYCIN 250 MG PO TABS: ORAL_TABLET | ORAL | 0 refills | Status: AC

## 2023-02-15 NOTE — ED Provider Notes (Signed)
[facility]    CSN: [id number] Arrival date & time: 02/15/23  1326      History   Chief Complaint Chief Complaint  Patient presents with   Cough   Nasal Congestion    HPI Jared Trujillo is a 42 y.o. male.   HPI  History obtained from the patient. 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 Has been very fatigued and had blood work recently and took his liver tests were elevated.  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  He smokes a 1 ppd for 20 or more years.    Fever: no Chills: no Sore throat:no Cough: yes Sputum: yes Chest tightness: no Shortness of breath: no Wheezing: no  Nasal congestion : no  Rhinorrhea: no Myalgias: no Appetite: normal  Hydration: normal  Abdominal pain: no Nausea: no Vomiting: no Diarrhea: No Rash: No Sleep disturbance: no Headache: no      Past Medical History:  Diagnosis Date   History of drug abuse [facility])     Patient Active Problem List   Diagnosis Date Noted   Allergic rhinitis 07/30/2022   Encounter for vasectomy counseling 07/30/2022   Chronic midline thoracic back pain 05/29/2022   Strain of back 11/11/2021   Gastroesophageal reflux disease 07/15/2021   Chronic nausea 11/21/2019   Risk for falls 03/10/2019   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 Authorizing  Provider  azithromycin (ZITHROMAX Z-PAK) 250 MG tablet Take 2 tablets on day 1 then 1 tablet daily 02/15/23  Yes Dailey Buccheri, DO  benzonatate (TESSALON) 100 MG capsule Take 1 capsule (100 mg total) by mouth every 8 (eight) hours. 02/15/23  Yes Shanira Tine, DO  buprenorphine-naloxone (SUBOXONE) 8-2 mg SUBL SL tablet Place 1 tablet under the tongue daily.   Yes [provider]  esomeprazole (NEXIUM) 40 MG capsule Take 40 mg by mouth every morning. 08/06/22  Yes [provider]  gabapentin (NEURONTIN) 300 MG capsule SMARTSIG:1-2 Capsule(s) By Mouth Every Evening 09/21/20  Yes [provider]  predniSONE (STERAPRED UNI-PAK 21 TAB) 10 MG (21) TBPK tablet Take by mouth daily. Take 6 tabs by mouth daily for 1, then 5 tabs for 1 day, then 4 tabs for 1 day, then 3 tabs for 1 day, then 2 tabs for 1 day, then 1 tab for 1 day. 02/15/23  Yes Jerelle Virden, DO  albuterol (VENTOLIN HFA) 108 (90 Base) MCG/ACT inhaler Inhale 2 puffs into the lungs every 4 (four) hours as needed. 09/18/22   Stayce Delancy, Seward Meth, DO  ARIPiprazole (ABILIFY) 10 MG tablet Take 10 mg by mouth daily.    [provider]  dicyclomine (BENTYL) 10 MG capsule Take 1 capsule (10 mg total) by mouth 3 (three) times daily as needed for up to 7 days for spasms. 02/14/20 11/25/22  Darci Current,  MD  fluticasone (FLONASE) 50 MCG/ACT nasal spray Place 2 sprays into both nostrils daily. 08/29/19   Anson Oregon, PA-C  promethazine-dextromethorphan (PROMETHAZINE-DM) 6.25-15 MG/5ML syrup Take 5 mLs by mouth 4 (four) times daily as needed. 02/15/23   Katha Cabal, DO    Family History Family History  Problem Relation Age of Onset   Congestive Heart Failure Mother    COPD Mother    Healthy Father     Social History Social History   Tobacco Use   Smoking status: Every Day    Current packs/day: 1.00    Average packs/day: 1 pack/day for 20.0 years (20.0 ttl pk-yrs)    Types: Cigarettes   Smokeless  tobacco: Current  Vaping Use   Vaping status: Every Day   Substances: Nicotine  Substance Use Topics   Alcohol use: Not Currently   Drug use: Not Currently    Types: IV    Comment: last heroin use 6 months ago     Allergies   Acetaminophen   Review of Systems Review of Systems: negative unless otherwise stated in HPI.      Physical Exam Triage Vital Signs ED Triage Vitals  Encounter Vitals Group     BP 02/15/23 1359 134/88     Systolic BP Percentile --      Diastolic BP Percentile --      Pulse Rate 02/15/23 1359 81     Resp 02/15/23 1359 15     Temp 02/15/23 1359 98.1 F (36.7 C)     Temp Source 02/15/23 1359 Oral     SpO2 02/15/23 1359 95 %     Weight 02/15/23 1357 164 lb 14.5 oz (74.8 kg)     Height 02/15/23 1357 5\' 8"  (1.727 m)     Head Circumference --      Peak Flow --      Pain Score 02/15/23 1357 0     Pain Loc --      Pain Education --      Exclude from Growth Chart --    No data found.  Updated Vital Signs BP 134/88 (BP Location: Right Arm)   Pulse 81   Temp 98.1 F (36.7 C) (Oral)   Resp 15   Ht 5\' 8"  (1.727 m)   Wt 74.8 kg   SpO2 95%   BMI 25.07 kg/m   Visual Acuity Right Eye Distance:   Left Eye Distance:   Bilateral Distance:    Right Eye Near:   Left Eye Near:    Bilateral Near:     Physical Exam GEN:     alert, non-toxic appearing male in no distress    HENT:  mucus membranes moist, clear nasal discharge EYES:   no scleral injection or discharge  RESP:  no increased work of breathing, rhonchi diffusely, no wheezing, no rales CVS:   regular rate and rhythm Skin:   warm and dry    UC Treatments / Results  Labs (all labs ordered are listed, but only abnormal results are displayed) Labs Reviewed - No data to display  EKG   Radiology DG Chest 2 View  Result Date: 02/15/2023 CLINICAL DATA:  Cough for months. EXAM: CHEST - 2 VIEW COMPARISON:  09/18/2022 FINDINGS: The cardiomediastinal contours are normal. The lungs are  clear. Pulmonary vasculature is normal. No consolidation, pleural effusion, or pneumothorax. No acute osseous abnormalities are seen. IMPRESSION: Negative radiographs of the chest. Electronically Signed   By: Narda Rutherford M.D.   On: 02/15/2023  15:50    Procedures Procedures (including critical care time)  Medications Ordered in UC Medications - No data to display  Initial Impression / Assessment and Plan / UC Course  I have reviewed the triage vital signs and the nursing notes.  Pertinent labs & imaging results that were available during my care of the patient were reviewed by me and considered in my medical decision making (see chart for details).       Pt is a 42 y.o. male who is a daily cigarette smoker presents for several weeks of cough.  [place] is  afebrile here without recent antipyretics. Satting 95% on room air. Overall pt is  non-toxic appearing, well hydrated, without respiratory distress. Pulmonary exam is remarkable for rhonchi that clear with cough.  After shared decision making, we will pursue chest x-ray at this time as it currently would not change management.  COVID testing deferred due to length of symptoms. Chest xray personally reviewed by me without focal pneumonia, pleural effusion, cardiomegaly or pneumothorax. Patient aware the radiologist has not read his xray and is comfortable with the preliminary read by me. Will review radiologist read when available and call patient if a change in plan is warranted.  Pt agreeable to this plan prior to discharge.   Treat acute bronchitis with steroids and antibiotics as below.  Promethazine DM cough syrup given and Tessalon Perles for cough and allow patient to rest.  Return and ED precautions given and patient voiced understanding.   Discussed MDM, treatment plan and plan for follow-up with patient who agrees with plan.      Final Clinical Impressions(s) / UC Diagnoses   Final diagnoses:  Cigarette smoker two packs a  day or less  Acute bronchitis, unspecified organism     Discharge Instructions      Your chest xray did not show evidence of pneumonia though the radiologist has not yet read it. If they find something that I didn't I will call you.  Stop by the pharmacy to pick up your prescriptions.  Follow up with your primary care provider as needed.         ED Prescriptions     Medication Sig Dispense Auth. Provider   promethazine-dextromethorphan (PROMETHAZINE-DM) 6.25-15 MG/5ML syrup Take 5 mLs by mouth 4 (four) times daily as needed. 118 mL Verl Whitmore, DO   benzonatate (TESSALON) 100 MG capsule Take 1 capsule (100 mg total) by mouth every 8 (eight) hours. 21 capsule Madelina Sanda, DO   azithromycin (ZITHROMAX Z-PAK) 250 MG tablet Take 2 tablets on day 1 then 1 tablet daily 6 tablet Delante Karapetyan, DO   predniSONE (STERAPRED UNI-PAK 21 TAB) 10 MG (21) TBPK tablet Take by mouth daily. Take 6 tabs by mouth daily for 1, then 5 tabs for 1 day, then 4 tabs for 1 day, then 3 tabs for 1 day, then 2 tabs for 1 day, then 1 tab for 1 day. 21 tablet Katha Cabal, DO      PDMP not reviewed this encounter.   Katha Cabal, DO 02/15/23 1714

## 2023-02-15 NOTE — ED Triage Notes (Signed)
Patent reports cough off and on for several months.  Patient reports runny nose and nasal congestion this morning.  Patient unsure of fevers.

## 2023-02-15 NOTE — Discharge Instructions (Addendum)
Your chest xray did not show evidence of pneumonia though the radiologist has not yet read it. If they find something that I didn't I will call you.    Stop by the pharmacy to pick up your prescriptions.  Follow up with your primary care provider as needed.

## 2023-03-16 ENCOUNTER — Ambulatory Visit
Admission: EM | Admit: 2023-03-16 | Discharge: 2023-03-16 | Disposition: A | Discharge status: Home / Self Care | Payer: Medicaid Other | Attending: Emergency Medicine | Admitting: Emergency Medicine

## 2023-03-16 ENCOUNTER — Encounter: Payer: Self-pay | Admitting: Emergency Medicine

## 2023-03-16 DIAGNOSIS — M545 Low back pain, unspecified: Secondary | ICD-10-CM

## 2023-03-16 MED ORDER — CYCLOBENZAPRINE HCL 10 MG PO TABS: 10.0000 mg | ORAL_TABLET | Freq: Every day | ORAL | 0 refills | Status: AC

## 2023-03-16 MED ORDER — DICLOFENAC SODIUM 75 MG PO TBEC: 75.0000 mg | DELAYED_RELEASE_TABLET | Freq: Two times a day (BID) | ORAL | 0 refills | Status: AC

## 2023-03-16 MED ORDER — KETOROLAC TROMETHAMINE 30 MG/ML IJ SOLN
30.0000 mg | Freq: Once | INTRAMUSCULAR | Status: AC
  Administered 2023-03-16: 30 mg via INTRAMUSCULAR

## 2023-03-16 NOTE — ED Triage Notes (Signed)
Pt was at work lifting 4 days and developed left side lower back pain that radiates down his left leg.

## 2023-03-16 NOTE — Discharge Instructions (Addendum)
Your pain is most likely caused by irritation to the muscles.  You had been given Toradol which helps to reduce inflammation and helps with pain, ideally will see improvement in the next hour  You may use Voltaren tablet twice daily, most effective when taking consistently so use for 5 days did not use as needed, may take Tylenol in addition to this or any topical medications  May use muscle relaxant at bedtime as needed for additional comfort, be mindful this may make you feel sleepy  You may use heating pad in 15 minute intervals as needed for additional comfort, or you may find comfort in using ice in 10-15 minutes over affected area  Begin massaging and stretching affected area daily for 10 minutes as tolerated to further loosen muscles   When sitting and  lying down place pillow underneath and between knees for support  Can try sleeping without pillow on firm mattress   Practice good posture: head back, shoulders back, chest forward, pelvis back and weight distributed evenly on both legs  If pain persist after recommended treatment or reoccurs if may be beneficial to follow up with orthopedic specialist for evaluation, this doctor specializes in the bones and can manage your symptoms long-term with options such as but not limited to imaging, medications or physical therapy

## 2023-03-16 NOTE — ED Provider Notes (Signed)
[facility]    CSN: [id number] Arrival date & time: 03/16/23  1604      History   Chief Complaint Chief Complaint  Patient presents with   Back Pain    HPI Jared Trujillo is a 42 y.o. male.   Presents for evaluation of left lower back pain radiating into the left leg present for 4 days.  Experiencing intermittent numbness to the top of the foot into the toes.  Left leg is described as feeling heavy.  Symptoms are exacerbated when walking.  Symptoms began after lifting a heavy object at work.  Has not attempted treatment of symptoms.  Denies urinary or bowel incontinence.  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 Authorizing Provider  buprenorphine-naloxone (SUBOXONE) 8-2 mg SUBL SL tablet Place 1 tablet under the tongue daily.   Yes [provider]  gabapentin (NEURONTIN) 300 MG capsule SMARTSIG:1-2 Capsule(s) By Mouth Every Evening 09/21/20  Yes [provider]  albuterol (VENTOLIN  HFA) 108 (90 Base) MCG/ACT inhaler Inhale 2 puffs into the lungs every 4 (four) hours as needed. 09/18/22   Brimage, Seward Meth, DO  ARIPiprazole (ABILIFY) 10 MG tablet Take 10 mg by mouth daily.    [provider]  azithromycin (ZITHROMAX Z-PAK) 250 MG tablet Take 2 tablets on day 1 then 1 tablet daily 02/15/23   Brimage, Vondra, DO  benzonatate (TESSALON) 100 MG capsule Take 1 capsule (100 mg total) by mouth every 8 (eight) hours. 02/15/23   Katha Cabal, DO  dicyclomine (BENTYL) 10 MG capsule Take 1 capsule (10 mg total) by mouth 3 (three) times daily as needed for up to 7 days for spasms. 02/14/20 11/25/22  Darci Current, MD  esomeprazole (NEXIUM) 40 MG capsule Take 40 mg by mouth every morning. 08/06/22   [provider]  fluticasone (FLONASE) 50 MCG/ACT nasal spray Place 2 sprays into both nostrils daily. 08/29/19   Anson Oregon, PA-C  predniSONE (STERAPRED UNI-PAK 21 TAB) 10 MG (21) TBPK tablet Take by mouth daily. Take 6 tabs by mouth daily for 1, then 5 tabs for 1 day, then 4 tabs for 1 day, then 3 tabs for 1 day, then 2 tabs for 1 day, then 1 tab for 1 day. 02/15/23   Katha Cabal, DO  promethazine-dextromethorphan (PROMETHAZINE-DM) 6.25-15 MG/5ML syrup Take 5 mLs by mouth 4 (four) times daily as needed. 02/15/23   Katha Cabal, DO    Family History Family History  Problem Relation Age of Onset   Congestive Heart Failure Mother    COPD Mother    Healthy Father     Social History Social History   Tobacco Use   Smoking status: Every Day    Current packs/day: 1.00    Average packs/day: 1 pack/day for 20.0 years (20.0 ttl pk-yrs)    Types: Cigarettes   Smokeless tobacco: Current  Vaping Use   Vaping status: Former   Substances: Nicotine  Substance Use Topics   Alcohol use: Not Currently   Drug use: Not Currently    Types: IV    Comment: last heroin use 6 months ago     Allergies   Acetaminophen   Review of Systems Review of  Systems   Physical Exam Triage Vital Signs ED Triage Vitals  Encounter Vitals Group     BP 03/16/23 1703 (!) 145/81     Systolic BP Percentile --      Diastolic BP Percentile --      Pulse Rate 03/16/23 1703 76     Resp 03/16/23 1703 18     Temp 03/16/23 1703 98.3 F (36.8 C)     Temp Source 03/16/23 1703 Oral     SpO2 03/16/23 1703 95 %     Weight --      Height --      Head Circumference --      Peak Flow --      Pain Score 03/16/23 1701 10     Pain Loc --      Pain Education --      Exclude from Growth Chart --    No data found.  Updated Vital Signs BP (!) 145/81 (BP Location: Left Arm)   Pulse 76   Temp 98.3 F (36.8 C) (Oral)   Resp 18   SpO2 95%   Visual Acuity Right Eye Distance:   Left Eye Distance:   Bilateral Distance:    Right Eye Near:   Left Eye Near:    Bilateral Near:     Physical Exam Constitutional:      Appearance: Normal appearance.  Eyes:     Extraocular Movements: Extraocular movements intact.  Pulmonary:     Effort: Pulmonary effort is normal.  Musculoskeletal:     Comments: Tenderness is present to the left lower lumbar region without ecchymosis swelling or deformity, positive straight straight leg test, able to sit erect without complication, able to twist her did.  Neurological:     Mental Status: He is alert and oriented to person, place, and time. Mental status is at baseline.      UC Treatments / Results  Labs (all labs ordered are listed, but only abnormal results are displayed) Labs Reviewed - No data to display  EKG   Radiology No results found.  Procedures Procedures (including critical care time)  Medications Ordered in UC Medications - No data to display  Initial Impression / Assessment and Plan / UC Course  I have reviewed the triage vital signs and the nursing notes.  Pertinent labs & imaging results that were available during my care of the patient were reviewed by me and considered in my medical  decision making (see chart for details).  Acute Left-sided low back pain without sciatica  Etiology muscular low suspicion for spinal involvement or saddle anesthesia, imaging deferred, IM Toradol given and prescribed prednisone and Flexeril for home use recommended RICE, heat massage stretching and activity as tolerated, may follow-up with urgent care as  needed Final Clinical Impressions(s) / UC Diagnoses   Final diagnoses:  None     Discharge Instructions      Your pain is most likely caused by irritation to the muscles.  You may use heating pad in 15 minute intervals as needed for additional comfort, or you may find comfort in using ice in 10-15 minutes over affected area  Begin massaging and stretching affected area daily for 10 minutes as tolerated to further loosen muscles   When sitting and  lying down place pillow underneath and between knees for support  Can try sleeping without pillow on firm mattress   Practice good posture: head back, shoulders back, chest forward, pelvis back and weight distributed evenly on both legs  If pain persist after recommended treatment or reoccurs if may be beneficial to follow up with orthopedic specialist for evaluation, this doctor specializes in the bones and can manage your symptoms long-term with options such as but not limited to imaging, medications or physical therapy      ED Prescriptions   None    PDMP not reviewed this encounter.   Valinda Hoar, [place] 03/17/23 [phone]

## 2023-03-23 ENCOUNTER — Other Ambulatory Visit: Payer: Self-pay

## 2023-03-23 ENCOUNTER — Emergency Department: Payer: Medicaid Other

## 2023-03-23 ENCOUNTER — Emergency Department
Admission: EM | Admit: 2023-03-23 | Discharge: 2023-03-23 | Disposition: A | Discharge status: Home / Self Care | Payer: Medicaid Other | Attending: Emergency Medicine | Admitting: Emergency Medicine

## 2023-03-23 DIAGNOSIS — M5442 Lumbago with sciatica, left side: Principal | ICD-10-CM

## 2023-03-23 DIAGNOSIS — G8929 Other chronic pain: Principal | ICD-10-CM

## 2023-03-23 DIAGNOSIS — M5432 Sciatica, left side: Secondary | ICD-10-CM | POA: Insufficient documentation

## 2023-03-23 DIAGNOSIS — F17200 Nicotine dependence, unspecified, uncomplicated: Secondary | ICD-10-CM | POA: Diagnosis not present

## 2023-03-23 DIAGNOSIS — K760 Fatty (change of) liver, not elsewhere classified: Secondary | ICD-10-CM | POA: Insufficient documentation

## 2023-03-23 DIAGNOSIS — R1011 Right upper quadrant pain: Secondary | ICD-10-CM | POA: Diagnosis present

## 2023-03-23 DIAGNOSIS — R17 Unspecified jaundice: Secondary | ICD-10-CM

## 2023-03-23 LAB — CBC WITH DIFFERENTIAL/PLATELET
Abs Immature Granulocytes: 0.03 10*3/uL (ref 0.00–0.07)
Basophils Absolute: 0.1 10*3/uL (ref 0.0–0.1)
Basophils Relative: 1 %
Eosinophils Absolute: 0.1 10*3/uL (ref 0.0–0.5)
Eosinophils Relative: 1 %
HCT: 46.2 % (ref 39.0–52.0)
Hemoglobin: 16.2 g/dL (ref 13.0–17.0)
Immature Granulocytes: 0 %
Lymphocytes Relative: 25 %
Lymphs Abs: 1.8 10*3/uL (ref 0.7–4.0)
MCH: 31.8 pg (ref 26.0–34.0)
MCHC: 35.1 g/dL (ref 30.0–36.0)
MCV: 90.6 fL (ref 80.0–100.0)
Monocytes Absolute: 0.6 10*3/uL (ref 0.1–1.0)
Monocytes Relative: 8 %
Neutro Abs: 4.7 10*3/uL (ref 1.7–7.7)
Neutrophils Relative %: 65 %
Platelets: 202 10*3/uL (ref 150–400)
RBC: 5.1 MIL/uL (ref 4.22–5.81)
RDW: 13 % (ref 11.5–15.5)
WBC: 7.3 10*3/uL (ref 4.0–10.5)
nRBC: 0 % (ref 0.0–0.2)

## 2023-03-23 LAB — COMPREHENSIVE METABOLIC PANEL
ALT: 45 U/L — ABNORMAL HIGH (ref 0–44)
AST: 51 U/L — ABNORMAL HIGH (ref 15–41)
Albumin: 4.5 g/dL (ref 3.5–5.0)
Alkaline Phosphatase: 52 U/L (ref 38–126)
Anion gap: 12 (ref 5–15)
BUN: 13 mg/dL (ref 6–20)
CO2: 24 mmol/L (ref 22–32)
Calcium: 9.4 mg/dL (ref 8.9–10.3)
Chloride: 101 mmol/L (ref 98–111)
Creatinine, Ser: 0.78 mg/dL (ref 0.61–1.24)
GFR, Estimated: >60 mL/min (ref >60)
Glucose, Bld: 107 mg/dL — ABNORMAL HIGH (ref 70–99)
Potassium: 3.4 mmol/L — ABNORMAL LOW (ref 3.5–5.1)
Sodium: 137 mmol/L (ref 135–145)
Total Bilirubin: 2.2 mg/dL — ABNORMAL HIGH (ref <1.2)
Total Protein: 7.9 g/dL (ref 6.5–8.1)

## 2023-03-23 LAB — HEPATITIS PANEL, ACUTE
HCV Ab: REACTIVE — AB
Hep A IgM: NONREACTIVE
Hep B C IgM: NONREACTIVE
Hepatitis B Surface Ag: NONREACTIVE

## 2023-03-23 LAB — LIPASE, BLOOD: Lipase: 22 U/L (ref 11–51)

## 2023-03-23 MED ORDER — ONDANSETRON HCL 4 MG/2ML IJ SOLN
4.0000 mg | Freq: Once | INTRAMUSCULAR | Status: AC
  Administered 2023-03-23: 4 mg via INTRAVENOUS
  Filled 2023-03-23: qty 2

## 2023-03-23 NOTE — Discharge Instructions (Addendum)
Please follow-up with your primary care provider within the next couple weeks for recheck of your liver enzymes and bilirubin to ensure continued improvement.  I also sent a referral for you to see neurology given your ongoing and worsening sciatic back pain.

## 2023-03-23 NOTE — ED Provider Notes (Addendum)
[facility] Provider Note    Event Date/Time   First MD Initiated Contact with Patient 03/23/23 0701     (approximate)   History   Muscle Pain and Abdominal Pain   HPI Jared Trujillo is a 42 y.o. male with history of tobacco abuse presenting today for abdominal pain.  Patient states he saw his primary care provider several weeks ago and was found to have elevated liver enzymes.  He has not followed this up yet.  He states he intermittently has right upper quadrant abdominal pain associated with nausea.  No vomiting, fevers, constipation, diarrhea, chest pain, shortness of breath, dysuria.  No prior history of abdominal surgeries.  He does report prior history of hepatitis C.  Reviewed most recent [facility] family medicine note on 01/22/2023.  Documented hepatitis C treatment at liver clinic there with normalized LFTs.  Hepatitis C antibody and LFTs normal on 02/2021.     Physical Exam   Triage Vital Signs: ED Triage Vitals  Encounter Vitals Group     BP 03/23/23 0638 (!) 152/100     Systolic BP Percentile --      Diastolic BP Percentile --      Pulse Rate 03/23/23 0638 94     Resp 03/23/23 0636 18     Temp 03/23/23 0636 98.6 F (37 C)     Temp Source 03/23/23 0636 Oral     SpO2 03/23/23 0636 100 %     Weight 03/23/23 0639 165 lb (74.8 kg)     Height 03/23/23 0639 5\' 9"  (1.753 m)     Head Circumference --      Peak Flow --      Pain Score 03/23/23 0639 10     Pain Loc --      Pain Education --      Exclude from Growth Chart --     Most recent vital signs: Vitals:   03/23/23 0636 03/23/23 0638  BP:  (!) 152/100  Pulse:  94  Resp: 18 18  Temp: 98.6 F (37 C)   SpO2: 100%    Physical Exam: I have reviewed the vital signs and nursing notes. General: Awake, alert, no acute distress.  Nontoxic appearing. Head:  Atraumatic, normocephalic.   ENT:  EOM intact, PERRL. Oral mucosa is pink and moist with no lesions. Neck: Neck is supple with full  range of motion, No meningeal signs. Cardiovascular:  RRR, No murmurs. Peripheral pulses palpable and equal bilaterally. Respiratory:  Symmetrical chest wall expansion.  No rhonchi, rales, or wheezes.  Good air movement throughout.  No use of accessory muscles.   Musculoskeletal:  No cyanosis or edema. Moving extremities with full ROM Abdomen:  Soft, mild tenderness to palpation in right upper quadrant, nondistended. Neuro:  GCS 15, moving all four extremities, interacting appropriately. Speech clear. Psych:  Calm, appropriate.   Skin:  Warm, dry, no rash.     ED Results / Procedures / Treatments   Labs (all labs ordered are listed, but only abnormal results are displayed) Labs Reviewed  COMPREHENSIVE METABOLIC PANEL - Abnormal; Notable for the following components:      Result Value   Potassium 3.4 (*)    Glucose, Bld 107 (*)    AST 51 (*)    ALT 45 (*)    Total Bilirubin 2.2 (*)    All other components within normal limits  LIPASE, BLOOD  CBC WITH DIFFERENTIAL/PLATELET  URINALYSIS, ROUTINE W REFLEX MICROSCOPIC  HEPATITIS PANEL, ACUTE  EKG    RADIOLOGY Independently interpreted ultrasound of right upper quadrant showing hepatic steatosis but no other acute findings   PROCEDURES:  Critical Care performed: No  Procedures   MEDICATIONS ORDERED IN ED: Medications  ondansetron (ZOFRAN) injection 4 mg (4 mg Intravenous Given 03/23/23 0731)     IMPRESSION / MDM / ASSESSMENT AND PLAN / ED COURSE  I reviewed the triage vital signs and the nursing notes.                              Differential diagnosis includes, but is not limited to, hepatitis, symptomatic cholelithiasis, cholecystitis  Patient's presentation is most consistent with acute complicated illness / injury requiring diagnostic workup.  Patient is a 42 year old male who is well-appearing presenting today for abnormal labs at PCP.  Found to have mildly elevated liver enzymes and has not received any  follow-up since then.  Overall, symptoms are minimal to nonexistent.  Physical exam and vital signs unremarkable.  Will obtain laboratory workup to trend liver enzymes as well as get right upper quadrant ultrasound for further evaluation.  Liver enzymes have down trended since most recent labs.  Although T. bili was slightly elevated at 2.2.  Right upper quadrant ultrasound shows evidence of hepatic steatosis but no other acute findings within the biliary system.  Patient is safe for discharge we will have him follow-up with his primary care provider for reevaluation.  Separately, also having worsening sciatic pain with prior MRIs of this.  Patient asking for neurosurgery referral which was placed today.  Hepatitis panel was sent out and will take a couple days to result which she can follow-up with his primary care provider.  The patient is on the cardiac monitor to evaluate for evidence of arrhythmia and/or significant heart rate changes. Clinical Course as of 03/23/23 0900  Mon Mar 23, 2023  7829 Hepatitis panel, acute Will be a send out lab the patient can follow-up with his primary care provider for [DW]  0750 Comprehensive metabolic panel(!) LFTs diminished in comparison to labs from 2 months ago.  New T. bili elevation which we will evaluate with right upper quadrant ultrasound [DW]  0856 US ABDOMEN LIMITED RUQ (LIVER/GB) No acute findings [DW]    Clinical Course User Index [DW] Janith Lima, MD     FINAL CLINICAL IMPRESSION(S) / ED DIAGNOSES   Final diagnoses:  Hepatic steatosis  Elevated bilirubin  Sciatica of left side     Rx / DC Orders   ED Discharge Orders          Ordered    Ambulatory referral to Neurosurgery        03/23/23 0858             Note:  This document was prepared using Dragon voice recognition software and may include unintentional dictation errors.   Janith Lima, MD 03/23/23 5621    Janith Lima, MD 03/23/23 [phone]

## 2023-03-23 NOTE — ED Triage Notes (Addendum)
Pt reports nausea and just not feeling right. Pt states muscle aches and chills that started yesterday. Pt denies CP, SOB. GCS 15. Pt is limping but states he was seen recently for that and placed on muscle relaxers. Pt also reports his liver enzymes were elevated with PCP a month ago but never followed up. Pt reports R sided ABD pain for 3 months. PMH: Hep C

## 2023-03-30 ENCOUNTER — Ambulatory Visit
Admit: 2023-03-30 | Discharge: 2023-03-31 | Payer: PRIVATE HEALTH INSURANCE | Attending: Student in an Organized Health Care Education/Training Program | Primary: Student in an Organized Health Care Education/Training Program

## 2023-03-30 DIAGNOSIS — G8929 Other chronic pain: Principal | ICD-10-CM

## 2023-03-30 DIAGNOSIS — M5442 Lumbago with sciatica, left side: Principal | ICD-10-CM

## 2023-03-30 MED ORDER — CYCLOBENZAPRINE 10 MG TABLET
ORAL_TABLET | Freq: Every evening | ORAL | 1 refills | 30 days | Status: CP | PRN
Start: 2023-03-30 — End: ?

## 2023-03-30 MED ORDER — GABAPENTIN 300 MG CAPSULE
ORAL_CAPSULE | Freq: Four times a day (QID) | ORAL | 1 refills | 90 days | Status: CP
Start: 2023-03-30 — End: 2023-09-26

## 2023-04-06 ENCOUNTER — Ambulatory Visit: Admit: 2023-04-06 | Discharge: 2023-04-07 | Payer: PRIVATE HEALTH INSURANCE

## 2023-04-06 DIAGNOSIS — G8929 Other chronic pain: Principal | ICD-10-CM

## 2023-04-06 DIAGNOSIS — M5442 Lumbago with sciatica, left side: Principal | ICD-10-CM

## 2023-04-06 DIAGNOSIS — F1190 Opioid use, unspecified, uncomplicated: Principal | ICD-10-CM

## 2023-04-06 DIAGNOSIS — Z8619 Personal history of other infectious and parasitic diseases: Principal | ICD-10-CM

## 2023-04-06 DIAGNOSIS — R7401 Elevated transaminase level: Principal | ICD-10-CM

## 2023-04-06 DIAGNOSIS — M5441 Lumbago with sciatica, right side: Principal | ICD-10-CM

## 2023-04-06 DIAGNOSIS — F1010 Alcohol abuse, uncomplicated: Principal | ICD-10-CM

## 2023-04-06 MED ORDER — SUBOXONE 8 MG-2 MG SUBLINGUAL FILM
ORAL_FILM | Freq: Four times a day (QID) | SUBLINGUAL | 2 refills | 30.00 days | Status: CP
Start: 2023-04-06 — End: 2023-07-05

## 2023-04-06 MED ORDER — ACAMPROSATE 333 MG TABLET,DELAYED RELEASE
ORAL_TABLET | Freq: Three times a day (TID) | ORAL | 2 refills | 30.00 days | Status: CP
Start: 2023-04-06 — End: 2023-07-05

## 2023-04-24 ENCOUNTER — Emergency Department
Admission: EM | Admit: 2023-04-24 | Discharge: 2023-04-24 | Disposition: A | Discharge status: Home / Self Care | Payer: Medicaid Other | Attending: Emergency Medicine | Admitting: Emergency Medicine

## 2023-04-24 ENCOUNTER — Other Ambulatory Visit: Payer: Self-pay

## 2023-04-24 DIAGNOSIS — F29 Unspecified psychosis not due to a substance or known physiological condition: Secondary | ICD-10-CM | POA: Diagnosis not present

## 2023-04-24 DIAGNOSIS — F10929 Alcohol use, unspecified with intoxication, unspecified: Secondary | ICD-10-CM | POA: Diagnosis not present

## 2023-04-24 DIAGNOSIS — R45851 Suicidal ideations: Secondary | ICD-10-CM | POA: Diagnosis not present

## 2023-04-24 DIAGNOSIS — R456 Violent behavior: Secondary | ICD-10-CM

## 2023-04-24 DIAGNOSIS — Y907 Blood alcohol level of 200-239 mg/100 ml: Secondary | ICD-10-CM | POA: Insufficient documentation

## 2023-04-24 DIAGNOSIS — F10920 Alcohol use, unspecified with intoxication, uncomplicated: Secondary | ICD-10-CM | POA: Insufficient documentation

## 2023-04-24 DIAGNOSIS — Z79899 Other long term (current) drug therapy: Secondary | ICD-10-CM | POA: Diagnosis not present

## 2023-04-24 DIAGNOSIS — F411 Generalized anxiety disorder: Secondary | ICD-10-CM | POA: Insufficient documentation

## 2023-04-24 HISTORY — DX: Fatty (change of) liver, not elsewhere classified: K76.0

## 2023-04-24 HISTORY — DX: Sciatica, unspecified side: M54.30

## 2023-04-24 LAB — URINE DRUG SCREEN, QUALITATIVE (ARMC ONLY)
Amphetamines, Ur Screen: NOT DETECTED
Barbiturates, Ur Screen: NOT DETECTED
Benzodiazepine, Ur Scrn: NOT DETECTED
Cannabinoid 50 Ng, Ur ~~LOC~~: POSITIVE — AB
Cocaine Metabolite,Ur ~~LOC~~: NOT DETECTED
MDMA (Ecstasy)Ur Screen: NOT DETECTED
Methadone Scn, Ur: NOT DETECTED
Opiate, Ur Screen: NOT DETECTED
Phencyclidine (PCP) Ur S: NOT DETECTED
Tricyclic, Ur Screen: NOT DETECTED

## 2023-04-24 LAB — URINALYSIS, ROUTINE W REFLEX MICROSCOPIC
Bacteria, UA: NONE SEEN
Bilirubin Urine: NEGATIVE
Glucose, UA: NEGATIVE mg/dL
Hgb urine dipstick: NEGATIVE
Ketones, ur: NEGATIVE mg/dL
Leukocytes,Ua: NEGATIVE
Nitrite: NEGATIVE
Protein, ur: 30 mg/dL — AB
Specific Gravity, Urine: 1.018 (ref 1.005–1.030)
Squamous Epithelial / HPF: 0 /[HPF] (ref 0–5)
pH: 5 (ref 5.0–8.0)

## 2023-04-24 LAB — CBC
HCT: 50.6 % (ref 39.0–52.0)
Hemoglobin: 17.4 g/dL — ABNORMAL HIGH (ref 13.0–17.0)
MCH: 31.9 pg (ref 26.0–34.0)
MCHC: 34.4 g/dL (ref 30.0–36.0)
MCV: 92.7 fL (ref 80.0–100.0)
Platelets: 232 10*3/uL (ref 150–400)
RBC: 5.46 MIL/uL (ref 4.22–5.81)
RDW: 13 % (ref 11.5–15.5)
WBC: 6.3 10*3/uL (ref 4.0–10.5)
nRBC: 0 % (ref 0.0–0.2)

## 2023-04-24 LAB — COMPREHENSIVE METABOLIC PANEL
ALT: 41 U/L (ref 0–44)
AST: 49 U/L — ABNORMAL HIGH (ref 15–41)
Albumin: 4.6 g/dL (ref 3.5–5.0)
Alkaline Phosphatase: 56 U/L (ref 38–126)
Anion gap: 12 (ref 5–15)
BUN: 10 mg/dL (ref 6–20)
CO2: 25 mmol/L (ref 22–32)
Calcium: 9.2 mg/dL (ref 8.9–10.3)
Chloride: 105 mmol/L (ref 98–111)
Creatinine, Ser: 0.92 mg/dL (ref 0.61–1.24)
GFR, Estimated: >60 mL/min (ref >60)
Glucose, Bld: 112 mg/dL — ABNORMAL HIGH (ref 70–99)
Potassium: 3.8 mmol/L (ref 3.5–5.1)
Sodium: 142 mmol/L (ref 135–145)
Total Bilirubin: 0.8 mg/dL (ref <1.2)
Total Protein: 8.2 g/dL — ABNORMAL HIGH (ref 6.5–8.1)

## 2023-04-24 LAB — ETHANOL: Alcohol, Ethyl (B): 228 mg/dL — ABNORMAL HIGH (ref <10)

## 2023-04-24 LAB — SALICYLATE LEVEL: Salicylate Lvl: <7 mg/dL — ABNORMAL LOW (ref 7.0–30.0)

## 2023-04-24 LAB — ACETAMINOPHEN LEVEL: Acetaminophen (Tylenol), Serum: <10 ug/mL — ABNORMAL LOW (ref 10–30)

## 2023-04-24 NOTE — Consult Note (Signed)
[facility] Psychiatric Consult Initial  Patient Name: .Jared Trujillo  MRN: [id number]  DOB: [birth year]  Consult Order details:  Orders (From admission, onward)     Start     Ordered   04/24/23 0332  CONSULT TO CALL ACT TEAM       Ordering Provider: Loleta Rose, MD  Provider:  (Not yet assigned)  Question:  Reason for Consult?  Answer:  Psych consult   04/24/23 0332   04/24/23 0332  IP CONSULT TO PSYCHIATRY       Ordering Provider: Loleta Rose, MD  Provider:  (Not yet assigned)  Question Answer Comment  Consult Timeframe URGENT - requires response within 12 hours   URGENT timeframe requires provider to provider communication, has the provider to provider communication been completed Yes   Reason for Consult? intoxicated, violent and destructive behavior at home, allegedly threatened multiple times to kill himself   Contact phone number where the requesting provider can be reached [phone]      04/24/23 0332             Mode of Visit: Tele-visit Virtual Statement:TELE PSYCHIATRY ATTESTATION & CONSENT As the provider for this telehealth consult, I attest that I verified the patient's identity using two separate identifiers, introduced myself to the patient, provided my credentials, disclosed my location, and performed this encounter via a HIPAA-compliant, real-time, face-to-face, two-way, interactive audio and video platform and with the full consent and agreement of the patient (or guardian as applicable.) Patient physical location: [facility]. Telehealth provider physical location: home office in state of [place].   Video start time: 1101 Video end time: 1130    Psychiatry Consult Evaluation  Service Date: April 24, 2023 LOS:  LOS: 0 days  Chief Complaint Parasuicidal ideation  Primary Psychiatric Diagnoses  Alcohol Use Disorder 2.  Alcohol Intoxication 3.  Gerneralized anxiety  Assessment  Jared Trujillo is a 42 y.o. male admitted: Presented to the [facility]  04/24/2023  2:04 AM for . He carries the psychiatric diagnoses of Opiate Use disorder and has no past medical history.    His current presentation of persistent and ongoing alcohol use despite medical and psychosocial consequences (ER hospitalization, familial disruption),  desire to cut down use with previous unsuccessful attempts, significant amounts of time spent using alcohol, loss of consciousness, impaired control over use, cravings, and evidence of tolerance are consistent with alcohol use disorder, moderate.  Additionally, while alcohol use is likely contributing to symptoms of irritability, it is felt that patient also meets criteria for comorbid generalized anxiety disorder given severity and persistence of symptoms.  Although patient carries recent episode of irritability, impulsivity, and property destruction presents as mania, there is low suspicion for bipolar spectrum illness at this time as patient denies history of mania or hypomania outside of active periods of substance use.  Patient endorses inconsistent adherence with medications impacted by substance use.  He expresses strong desire for outpatient rehab and shows insight into importance of going; agree with this goal if possible to minimize risk of return to use.  Although patient has history of suicidal ideation, he denies currently and it is not felt that he meets criteria for involuntary commitment at this time.   Today, patient reports reasonable level of anxiety related to next steps once he leaves the hospital but remains committed in his desire to go to outpatient rehab for alcohol use disorder after medical hospitalization. He identifies his fiance and children as major motivational factors. No acute  safety concerns on interview. Reports sleeping well. Otherwise, no changes to plan of care at this time.   Diagnoses:  Active Hospital problems: Active Problems:   * No active hospital problems. *    Plan   ## Psychiatric  Medication Recommendations:  Prn agitation protocol CIWA protocol -Recommend resuming Suboxone 4mg  QID per patient.   ## Medical Decision Making Capacity: Not specifically addressed in this encounter  ## Further Work-up:  -- None at this While pt on Qtc prolonging medications, please monitor & replete K+ to 4 and Mg2+ to 2, TOC consult for substance abuse resources, U/A, or UDS -- EKG  -- Pertinent labwork reviewed earlier this admission includes: BAL 228. AST 49 and total protien 8.2, and elevated hemoglobin 17.4   ## Disposition:-- Plan Post Discharge/Psychiatric Care Follow-up resources   -Rescind IVC no longer danger to self or others. ## Behavioral / Environmental: - No specific recommendations at this time.     ## Safety and Observation Level:  - Based on my clinical evaluation, I estimate the patient to be at low risk of self harm in the current setting. - At this time, we recommend  routine. This decision is based on my review of the chart including patient's history and current presentation, interview of the patient, mental status examination, and consideration of suicide risk including evaluating suicidal ideation, plan, intent, suicidal or self-harm behaviors, risk factors, and protective factors. This judgment is based on our ability to directly address suicide risk, implement suicide prevention strategies, and develop a safety plan while the patient is in the clinical setting. Please contact our team if there is a concern that risk level has changed.  CSSR Risk Category:C-SSRS RISK CATEGORY: No Risk  Suicide Risk Assessment: Patient has following modifiable risk factors for suicide: recklessness and medication noncompliance, which we are addressing by rest, medication, and alcohol metabolization. Patient has following non-modifiable or demographic risk factors for suicide: male gender Patient has the following protective factors against suicide: Access to outpatient mental  health care, Supportive family, Supportive friends, Minor children in the home, Frustration tolerance, no history of suicide attempts, and no history of NSSIB  Thank you for this consult request. Recommendations have been communicated to the primary team.  We will sign off at this time.   Maryagnes Amos, FNP       History of Present Illness  Relevant Aspects of Hospital ED Course:  Admitted on 04/24/2023 for alcohol intoxication and suicidal statements.    Patient Report:  Being under the influence and not being able to recall yesterdays events or his statements. He states he had 1/5 of [employer] liquor and has never acted like that before. He reports the most he has drank is a 1 pint. He reports that he remembers destroying his apartment before the police came and he remembers what was said that triggered him. He denies history of drinking until he loses consciousness. He denies any psychiatric history initially, further assessment revealed history of opiate use disorder that is being managed with Suboxone. Pt is IVC.  Pt denies SI or HI. Pt endorse use of cbd gummies, and UDS positive for THC on admission. BAL 228 on admission.     On evaluation Jared Trujillo is observed to be lying in bed, eyes closed however he does wake up and acknowledge entry into the room.  He also sits up to participate and engage with Clinical research associate.  Patient does show willingness to participate in his treatment plan,  denies having any comments questions or concerns.  He is able to verbalize wanting to go home, and importance of watching alcohol intake. Patient continues to show fair insight into mental illness, and ongoing need for long-term medications particularly substance/alcohol use medications.  Patient continues to denies suicidal ideations, homicidal ideations, and or auditory or visual hallucinations.  Patient reports tolerating well his oral medication and he appears to be psychiatrically stable.  Patient  aggravating issue that was substantially presented on admission has resolved, and patient appears to be nearing his baseline. Consent was obtained to speak with his fiance who was present, and patient provided contact information.  He does not appear to be responding to internal stimuli, external stimuli, and or exhibiting delusional thought disorder.  Psych ROS:  Depression: Denies Anxiety:  Denies Mania (lifetime and current): Denies Psychosis: (lifetime and current): Denies  Collateral information: [phone] Contacted Jared Trujillo (fiance) at 12/27 on 1220pm. Jared Trujillo was able to provide two patient identifiers. She reports that she strongly feels like it was the alcohol that lead to this ER visit. She states he has never acted in such a manner, and felt it was the alcohol. She denies any safety concerns at this time. She states her and the children are safe, their are no concerns about violence or abuse towards others. She denies any recent or known suicide attempts, irritability, mania, self harm.    Review of Systems  Psychiatric/Behavioral:  Positive for substance abuse (Alcohol use disorder). Negative for depression, hallucinations, memory loss and suicidal ideas. The patient is not nervous/anxious and does not have insomnia.   All other systems reviewed and are negative.    Psychiatric and Social History  Psychiatric History:  Information collected from patient, chart review and collateral from spouse.  Prev Dx/Sx: Opiate Use Disorder, Panic Attacks Current Psych Provider: [facility] Meds (current): Suboxone Previous Med Trials: Anti-anxiety medications, currently not taking anything.  Therapy: Receiving substance abuse therapy  Prior Psych Hospitalization: Denies  Prior Self Harm: Denies Prior Violence:Denies  Family Psych History: Denies Family Hx suicide: Denies  Social History:  Occupational Hx: Research officer, political party Hx: Denies Living Situation: Resides alone Spiritual  Hx:  Access to weapons/lethal means: No    Substance History Alcohol: Liquor  Type of alcohol Liquor Last Drink: yesterday Number of drinks per day 1 pint every 3 days History of alcohol withdrawal seizures Denies History of DT's Denies Tobacco: Daily Illicit drugs: CBD gummies Prescription drug abuse: Denies Rehab hx:   Exam Findings  Physical Exam: Deferred Telepsych Vital Signs:  Temp:  [98.2 F (36.8 C)-98.6 F (37 C)] 98.6 F (37 C) (12/27 0839) Pulse Rate:  [70-94] 70 (12/27 0839) Resp:  [15-16] 15 (12/27 0839) BP: (113-127)/(66-99) 113/66 (12/27 0839) SpO2:  [96 %-97 %] 96 % (12/27 0839) Weight:  [74.8 kg] 74.8 kg (12/27 0230) Blood pressure 113/66, pulse 70, temperature 98.6 F (37 C), temperature source Oral, resp. rate 15, height 5\' 6"  (1.676 m), weight 74.8 kg, SpO2 96%. Body mass index is 26.63 kg/m.  Physical Exam Vitals and nursing note reviewed.  Constitutional:      Appearance: Normal appearance. He is normal weight.  Neurological:     General: No focal deficit present.     Mental Status: He is alert and oriented to person, place, and time. Mental status is at baseline.  Psychiatric:        Mood and Affect: Mood normal.        Behavior: Behavior normal.  Thought Content: Thought content normal.        Judgment: Judgment normal.     Mental Status Exam: General Appearance: Fairly Groomed  Orientation:  Full (Time, Place, and Person)  Memory:  Immediate;   Fair Recent;   Fair Remote;   Fair  Concentration:  Concentration: Fair and Attention Span: Fair  Recall:  Fair  Attention  Fair  Eye Contact:  Good  Speech:  Clear and Coherent and Normal Rate  Language:  Fair  Volume:  Normal  Mood: Im ok better  Affect:  Appropriate and Congruent  Thought Process:  Coherent and Goal Directed  Thought Content:  WDL  Suicidal Thoughts:  No  Homicidal Thoughts:  No  Judgement:  Good  Insight:  Good  Psychomotor Activity:  Normal  Akathisia:  NA   Fund of Knowledge:  Good      Assets:  Communication Skills Desire for Improvement Financial Resources/Insurance Leisure Time Physical Health Resilience Social Support  Cognition:  WNL  ADL's:  Intact  AIMS (if indicated):        Other History   These have been pulled in through the EMR, reviewed, and updated if appropriate.  Family History:  The patient's family history includes COPD in his mother; Congestive Heart Failure in his mother; Healthy in his father.  Medical History: Past Medical History:  Diagnosis Date   Fatty liver    History of drug abuse (HCC)    Sciatica     Surgical History: Past Surgical History:  Procedure Laterality Date   MOUTH SURGERY     WRIST FRACTURE SURGERY Left      Medications:  No current facility-administered medications for this encounter.  Current Outpatient Medications:    albuterol (VENTOLIN HFA) 108 (90 Base) MCG/ACT inhaler, Inhale 2 puffs into the lungs every 4 (four) hours as needed., Disp: 6.7 g, Rfl: 0   ARIPiprazole (ABILIFY) 10 MG tablet, Take 10 mg by mouth daily., Disp: , Rfl:    azithromycin (ZITHROMAX Z-PAK) 250 MG tablet, Take 2 tablets on day 1 then 1 tablet daily, Disp: 6 tablet, Rfl: 0   benzonatate (TESSALON) 100 MG capsule, Take 1 capsule (100 mg total) by mouth every 8 (eight) hours., Disp: 21 capsule, Rfl: 0   buprenorphine-naloxone (SUBOXONE) 8-2 mg SUBL SL tablet, Place 1 tablet under the tongue daily., Disp: , Rfl:    cyclobenzaprine (FLEXERIL) 10 MG tablet, Take 1 tablet (10 mg total) by mouth at bedtime., Disp: 10 tablet, Rfl: 0   diclofenac (VOLTAREN) 75 MG EC tablet, Take 1 tablet (75 mg total) by mouth 2 (two) times daily., Disp: 20 tablet, Rfl: 0   dicyclomine (BENTYL) 10 MG capsule, Take 1 capsule (10 mg total) by mouth 3 (three) times daily as needed for up to 7 days for spasms., Disp: 21 capsule, Rfl: 0   esomeprazole (NEXIUM) 40 MG capsule, Take 40 mg by mouth every morning., Disp: , Rfl:     fluticasone (FLONASE) 50 MCG/ACT nasal spray, Place 2 sprays into both nostrils daily., Disp: 15.8 mL, Rfl: 2   gabapentin (NEURONTIN) 300 MG capsule, SMARTSIG:1-2 Capsule(s) By Mouth Every Evening, Disp: , Rfl:    predniSONE (STERAPRED UNI-PAK 21 TAB) 10 MG (21) TBPK tablet, Take by mouth daily. Take 6 tabs by mouth daily for 1, then 5 tabs for 1 day, then 4 tabs for 1 day, then 3 tabs for 1 day, then 2 tabs for 1 day, then 1 tab for 1 day., Disp: 21 tablet, Rfl:  0   promethazine-dextromethorphan (PROMETHAZINE-DM) 6.25-15 MG/5ML syrup, Take 5 mLs by mouth 4 (four) times daily as needed., Disp: 118 mL, Rfl: 0  Allergies: Allergies  Allergen Reactions   Acetaminophen Hives    Maryagnes Amos, FNP

## 2023-04-24 NOTE — BH Assessment (Signed)
Comprehensive Clinical Assessment (CCA) Screening, Triage and Referral Note  04/24/2023 Jared Trujillo [id number] Recommendations for Services/Supports/Treatments: Psych consult/disposition pending. Jared Trujillo is a 42 y.o., Caucasian, Not Hispanic or Latino ethnicity, ENGLISH speaking male with an unknown psych hx.  Per triage note: Pt arrives under IVC with [employer]. Deputy reports pts fianc called law enforcement due to pt. making suicidal comments and "tearing up" house.  On assessment, pt. had a sullen mood and a congruent affect. When asked how pt. was feeling the pt. stated, "I'm good. I just drank too much". Of note. the pt. declined getting substance abuse treatment. Pt admitted to eating a cannabis gummy 2 days ago. Pt explained that he doesn't need treatment because he does not drink regularly and he'd simply ingested too much prior to his arrival. Pt had lacking insight because he was able to admit that he'd thrown a microwave prior to arrival; however, the pt. reported that he could not remember exactly what he'd done. Pt could not recall how much he'd drank. Pt minimized his actions and blamed his fianc for telling law enforcement that he'd threatened suicide. Pt denied verbalizing any suicidal statements. Pt identified his main stressors as I lost my job last week, getting hurt on the job November 4th, and getting the news about having fat in his liver. Pt expressed feelings of feeling unloved and that his fianc just wants him out of the way. Pt is not connected to services. Pt denied having symptoms of depression but endorsed having anxiety. Pt stated, "Everyone has something going on". Pt presented with relevant and linear thought processes. Pt denied current HI/AV/H.  Please be advised the pt reported that he is having symptoms of withdrawal (chills), secondary to not receiving his dose of suboxone yesterday.  Collateral: Jared Trujillo (Significant other) [phone] SO  reported that the pt. drank too much and has began drinking heavier over the past year. SO explained that there are constant conflicts about the pt.'s drinking. SO reported that the pt. trashed the home explaining that he broke the TV, microwave, the Christmas tree and threw a table. SO reported that the pt. has a pattern of threatening suicide when extremely upset. SO reported that the pt.'s children were negatively impacted by the episode. SO reported that the pt. proceeded to threatened to commit suicide if she left the house because he didn't want her to leave. SO explained that she then called law enforcement in order for the pt. to get help.   Chief Complaint:  Chief Complaint  Patient presents with   Psychiatric Evaluation   Visit Diagnosis: Alcohol   Patient Reported Information How did you hear about [place]? No data recorded What Is the Reason for Your Visit/Call Today? No data recorded How Long Has This Been Causing You Problems? No data recorded What Do You Feel Would Help You the Most Today? No data recorded  Have You Recently Had Any Thoughts About Hurting Yourself? No data recorded Are You Planning to Commit Suicide/Harm Yourself At This time? No data recorded  Have you Recently Had Thoughts About Hurting Someone [name]? No data recorded Are You Planning to Harm Someone at This Time? No data recorded Explanation: No data recorded  Have You Used Any Alcohol or Drugs in the Past 24 Hours? No data recorded How Long Ago Did You Use Drugs or Alcohol? No data recorded What Did You Use and How Much? No data recorded  Do You Currently Have a Therapist/Psychiatrist? No  data recorded Name of Therapist/Psychiatrist: No data recorded  Have You Been Recently Discharged From Any Office Practice or Programs? No data recorded Explanation of Discharge From Practice/Program: No data recorded   CCA Screening Triage Referral Assessment Type of Contact: No data recorded Telemedicine Service  Delivery:   Is this Initial or Reassessment?   Date Telepsych consult ordered in [facility]:    Time Telepsych consult ordered in [facility]:    Location of Assessment: No data recorded Provider Location: No data recorded   Collateral Involvement: No data recorded  Does Patient Have a Court Appointed Legal Guardian? No data recorded Name and Contact of Legal Guardian: No data recorded If Minor and Not Living with Parent(s), Who has Custody? No data recorded Is CPS involved or ever been involved? No data recorded Is APS involved or ever been involved? No data recorded  Patient Determined To Be At Risk for Harm To Self or Others Based on Review of Patient Reported Information or Presenting Complaint? No data recorded Method: No data recorded Availability of Means: No data recorded Intent: No data recorded Notification Required: No data recorded Additional Information for Danger to Others Potential: No data recorded Additional Comments for Danger to Others Potential: No data recorded Are There Guns or Other Weapons in Your Home? No data recorded Types of Guns/Weapons: No data recorded Are These Weapons Safely Secured?                            No data recorded Who Could Verify You Are Able To Have These Secured: No data recorded Do You Have any Outstanding Charges, Pending Court Dates, Parole/Probation? No data recorded Contacted To Inform of Risk of Harm To Self or Others: No data recorded  Does Patient Present under Involuntary Commitment? No data recorded   [place] of Residence: No data recorded  Patient Currently Receiving the Following Services: No data recorded  Determination of Need: No data recorded  Options For Referral: No data recorded  Disposition Recommendation per psychiatric provider: Psych consult/disposition pending  Malikah Principato R Otto Caraway, LCAS

## 2023-04-24 NOTE — ED Notes (Signed)
Legal document scanned and e-filed

## 2023-04-24 NOTE — ED Notes (Signed)
Pt discharged from ED, Denies all SI/HI and stated he has never had any A/V hallucinations. Pt belongings returned.  Pt left ambulatory via taxi to home

## 2023-04-24 NOTE — ED Provider Notes (Signed)
Psychiatry recommended reversing involuntary commitment, clinically sober and denying any suicidal ideation.  Provided outpatient resources.  IVC was rescinded.   Corena Herter, MD 04/24/23 1421

## 2023-04-24 NOTE — ED Provider Notes (Signed)
[facility] Provider Note    Event Date/Time   First MD Initiated Contact with Patient 04/24/23 0216     (approximate)   History   Psychiatric Evaluation   HPI Jared Trujillo is a 42 y.o. male reportedly with a prior history of polysubstance abuse including opioid use and chronic back pain.  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  Allegedly his fiance called enforcement because the patient was threatening to kill himself, repeatedly making statements like how she should call the corner because he would be dead in the morning.  He also exhibited violent and destructive behavior by "tearing up the house".  Sheriff's deputies put him under involuntary commitment and brought him to the emergency department.  He has been irritable but relatively cooperative.  He denies all of this except admitting that he "threw the Christmas tree".  He states that his fiance is out to get him.  He denies suicidal ideation.     Physical Exam   Triage Vital Signs: ED Triage Vitals  Encounter Vitals Group     BP 04/24/23 0212 (!) 127/99     Systolic BP Percentile --      Diastolic BP Percentile --      Pulse Rate 04/24/23 0212 94     Resp 04/24/23 0212 16     Temp 04/24/23 0212 98.2 F (36.8 C)     Temp Source 04/24/23 0212 Oral     SpO2 04/24/23 0212 97 %     Weight 04/24/23 0230 74.8 kg (165 lb)     Height 04/24/23 0230 1.676 m (5\' 6" )     Head Circumference --      Peak Flow --      Pain Score 04/24/23 0230 0     Pain Loc --      Pain Education --      Exclude from Growth Chart --     Most recent vital signs: Vitals:   04/24/23 0212  BP: (!) 127/99  Pulse: 94  Resp: 16  Temp: 98.2 F (36.8 C)  SpO2: 97%    General: Awake, no distress.  CV:  Good peripheral perfusion.  Resp:  Normal effort. Speaking easily and comfortably, no accessory muscle usage nor intercostal retractions.   Abd:  No distention.  Other:  Irritable but not  making any threats at this time.  Denies SI and HI.  Appears clinically sober and is ambulating without difficulty, not requiring assistance, and is not slurring his speech.   ED Results / Procedures / Treatments   Labs (all labs ordered are listed, but only abnormal results are displayed) Labs Reviewed  URINALYSIS, ROUTINE W REFLEX MICROSCOPIC - Abnormal; Notable for the following components:      Result Value   Color, Urine YELLOW (*)    APPearance CLEAR (*)    Protein, ur 30 (*)    All other components within normal limits  URINE DRUG SCREEN, QUALITATIVE ([facility] ONLY) - Abnormal; Notable for the following components:   Cannabinoid 50 Ng, Ur [place] POSITIVE (*)    All other components within normal limits  CBC - Abnormal; Notable for the following components:   Hemoglobin 17.4 (*)    All other components within normal limits  COMPREHENSIVE METABOLIC PANEL - Abnormal; Notable for the following components:   Glucose, Bld 112 (*)    Total Protein 8.2 (*)    AST 49 (*)    All other components within normal limits  ETHANOL - Abnormal; Notable for the following components:   Alcohol, Ethyl (B) 228 (*)    All other components within normal limits  SALICYLATE LEVEL - Abnormal; Notable for the following components:   Salicylate Lvl <7.0 (*)    All other components within normal limits  ACETAMINOPHEN LEVEL - Abnormal; Notable for the following components:   Acetaminophen (Tylenol), Serum <10 (*)    All other components within normal limits     PROCEDURES:  Critical Care performed: No  Procedures    IMPRESSION / MDM / ASSESSMENT AND PLAN / ED COURSE  I reviewed the triage vital signs and the nursing notes.                              Differential diagnosis includes, but is not limited to, substance-induced mood disorder, adjustment disorder, depression, intoxication, medication or drug side effect.  Patient's presentation is most consistent with acute presentation with potential  threat to life or bodily function.  Labs/studies ordered: As per protocol, I ordered the following labs as part of the patient's medical and psychiatric evaluation:  CBC, CMP, ethanol level, acetaminophen level, salicylate level, urine drug screen.  Interventions/Medications given:  Medications - No data to display  (Note:  hospital course my include additional interventions and/or labs/studies not listed above.)   Stable vitals, reassuring lab work.  Patient is intoxicated with an ethanol level of 228 but is acting clinically sober.  Given the concerning report on the IVC, I will uphold it for now and the patient requires psychiatric evaluation and disposition recommendations.  He is medically cleared for psychiatric assessment.  The patient has been placed in psychiatric observation due to the need to provide a safe environment for the patient while obtaining psychiatric consultation and evaluation, as well as ongoing medical and medication management to treat the patient's condition.  The patient has been placed under full IVC at this time.       FINAL CLINICAL IMPRESSION(S) / ED DIAGNOSES   Final diagnoses:  Suicidal ideation  Violent behavior  Alcoholic intoxication with complication (HCC)     Rx / DC Orders   ED Discharge Orders     None        Note:  This document was prepared using Dragon voice recognition software and may include unintentional dictation errors.   Loleta Rose, MD 04/24/23 [phone]

## 2023-04-24 NOTE — ED Provider Notes (Addendum)
Emergency Medicine Observation Re-evaluation Note  Jared Trujillo is a 42 y.o. male, seen on rounds today.  Pt initially presented to the ED for complaints of Psychiatric Evaluation  Currently, the patient is calm, no acute complaints.  Physical Exam  Blood pressure (!) 127/99, pulse 94, temperature 98.2 F (36.8 C), temperature source Oral, resp. rate 16, height 5\' 6"  (1.676 m), weight 74.8 kg, SpO2 97%. Physical Exam General: NAD Lungs: CTAB Psych: not agitated  ED Course / MDM  EKG:    I have reviewed the labs performed to date as well as medications administered while in observation.  Recent changes in the last 24 hours include no acute events overnight.    Plan  Current plan is for psych eval and dispo. Patient is under full IVC at this time.   Sharman Cheek, MD 04/24/23 0820   ----------------------------------------- 1:01 PM on 04/24/2023 ----------------------------------------- Discussed with psychiatry who finds patient stable for discharge.  They will rescind IVC.  He is clinically sober.   Sharman Cheek, MD 04/24/23 [phone]

## 2023-04-24 NOTE — ED Notes (Signed)
Pt is calm and cooperative this a.m., Mr Jared Trujillo has voiced remorse over his behaviors r/t surrounding circumstances of admission.  Mr Jared Trujillo stated "I feel horrible this morning, I knew better to drink like that, I'm not even really a drinker.  I tore up my house and yelled some mean and crazy shit to my wife." Pt requesting to use phone at 0900 to call and make amends with spouse.

## 2023-04-24 NOTE — ED Triage Notes (Addendum)
Pt arrives under IVC with [employer]. Deputy reports pts fiance called law enforcement due to pt making suicidal comments and "tearing up" house.  Pt changed into burgundy scrubs per facility protocol with this RN and Jon Gills, EDT present. Pts belongings placed into single belongings bag and labeled with pt identifier. Pt belongings include: pair of shoes, pair of socks, black underwear, jeans, black belt, blue tshirt, blue flannel shirt with buttons, black jacket, three gray rings, pack of cigarettes, phone, wallet, and two lighters.

## 2023-04-24 NOTE — ED Notes (Signed)
TTS in room with telepsych

## 2023-07-04 MED ORDER — SUBOXONE 8 MG-2 MG SUBLINGUAL FILM
ORAL_FILM | Freq: Four times a day (QID) | SUBLINGUAL | 2 refills | 30.00 days
Start: 2023-07-04 — End: 2023-10-02

## 2023-07-04 MED ORDER — NALOXONE 4 MG/ACTUATION NASAL SPRAY
0 refills | 0.00 days
Start: 2023-07-04 — End: ?

## 2023-07-06 ENCOUNTER — Ambulatory Visit: Admit: 2023-07-06 | Discharge: 2023-07-07 | Payer: PRIVATE HEALTH INSURANCE

## 2023-07-06 DIAGNOSIS — F1010 Alcohol abuse, uncomplicated: Principal | ICD-10-CM

## 2023-07-06 DIAGNOSIS — R7401 Elevated transaminase level: Principal | ICD-10-CM

## 2023-07-06 DIAGNOSIS — R5381 Other malaise: Principal | ICD-10-CM

## 2023-07-06 DIAGNOSIS — R1011 Right upper quadrant pain: Principal | ICD-10-CM

## 2023-07-06 DIAGNOSIS — K5903 Drug induced constipation: Principal | ICD-10-CM

## 2023-07-06 DIAGNOSIS — R5383 Other fatigue: Principal | ICD-10-CM

## 2023-07-06 DIAGNOSIS — F1190 Opioid use, unspecified, uncomplicated: Principal | ICD-10-CM

## 2023-07-06 DIAGNOSIS — K760 Fatty (change of) liver, not elsewhere classified: Principal | ICD-10-CM

## 2023-07-06 MED ORDER — NALOXONE 4 MG/ACTUATION NASAL SPRAY
0 refills | 0.00 days | Status: CP
Start: 2023-07-06 — End: ?

## 2023-07-06 MED ORDER — SUBOXONE 8 MG-2 MG SUBLINGUAL FILM
ORAL_FILM | Freq: Four times a day (QID) | SUBLINGUAL | 2 refills | 30.00 days | Status: CP
Start: 2023-07-06 — End: 2023-10-04

## 2023-07-14 DIAGNOSIS — F1190 Opioid use, unspecified, uncomplicated: Principal | ICD-10-CM

## 2023-07-16 ENCOUNTER — Inpatient Hospital Stay: Admit: 2023-07-16 | Discharge: 2023-07-16 | Payer: PRIVATE HEALTH INSURANCE

## 2023-09-20 MED ORDER — GABAPENTIN 300 MG CAPSULE
ORAL_CAPSULE | Freq: Four times a day (QID) | ORAL | 0 refills | 0.00000 days
Start: 2023-09-20 — End: ?

## 2023-09-22 MED ORDER — GABAPENTIN 300 MG CAPSULE
ORAL_CAPSULE | Freq: Four times a day (QID) | ORAL | 0 refills | 90.00000 days | Status: CP
Start: 2023-09-22 — End: ?

## 2023-10-06 MED ORDER — SUBOXONE 8 MG-2 MG SUBLINGUAL FILM
ORAL_FILM | Freq: Four times a day (QID) | SUBLINGUAL | 2 refills | 30.00000 days
Start: 2023-10-06 — End: 2024-01-04

## 2023-10-07 ENCOUNTER — Ambulatory Visit: Admit: 2023-10-07 | Discharge: 2023-10-08 | Payer: Medicaid (Managed Care)

## 2023-10-07 DIAGNOSIS — F17200 Nicotine dependence, unspecified, uncomplicated: Principal | ICD-10-CM

## 2023-10-07 DIAGNOSIS — F1010 Alcohol abuse, uncomplicated: Principal | ICD-10-CM

## 2023-10-07 DIAGNOSIS — R632 Polyphagia: Principal | ICD-10-CM

## 2023-10-07 DIAGNOSIS — F1190 Opioid use, unspecified, uncomplicated: Principal | ICD-10-CM

## 2023-10-07 DIAGNOSIS — R062 Wheezing: Principal | ICD-10-CM

## 2023-10-07 DIAGNOSIS — K760 Fatty (change of) liver, not elsewhere classified: Principal | ICD-10-CM

## 2023-10-07 MED ORDER — SUBOXONE 8 MG-2 MG SUBLINGUAL FILM
ORAL_FILM | Freq: Four times a day (QID) | SUBLINGUAL | 2 refills | 30.00000 days | Status: CP
Start: 2023-10-07 — End: 2024-01-05

## 2023-10-07 MED ORDER — ALBUTEROL SULFATE HFA 90 MCG/ACTUATION AEROSOL INHALER
Freq: Four times a day (QID) | RESPIRATORY_TRACT | 0 refills | 0.00000 days | Status: CP | PRN
Start: 2023-10-07 — End: ?

## 2023-10-07 MED ORDER — ACAMPROSATE 333 MG TABLET,DELAYED RELEASE
ORAL_TABLET | Freq: Three times a day (TID) | ORAL | 2 refills | 30.00000 days | Status: CP
Start: 2023-10-07 — End: 2024-01-05

## 2023-10-13 DIAGNOSIS — F1190 Opioid use, unspecified, uncomplicated: Principal | ICD-10-CM

## 2023-11-24 MED ORDER — GABAPENTIN 300 MG CAPSULE
ORAL_CAPSULE | Freq: Four times a day (QID) | ORAL | 0 refills | 0.00000 days
Start: 2023-11-24 — End: ?

## 2023-11-26 MED ORDER — GABAPENTIN 300 MG CAPSULE
ORAL_CAPSULE | Freq: Four times a day (QID) | ORAL | 0 refills | 90.00000 days | Status: CP
Start: 2023-11-26 — End: ?

## 2023-12-24 MED ORDER — ESOMEPRAZOLE MAGNESIUM 40 MG CAPSULE,DELAYED RELEASE
ORAL_CAPSULE | Freq: Every day | ORAL | 0 refills | 90.00000 days | Status: CP
Start: 2023-12-24 — End: ?

## 2023-12-29 DIAGNOSIS — F1190 Opioid use, unspecified, uncomplicated: Principal | ICD-10-CM

## 2023-12-29 MED ORDER — SUBOXONE 8 MG-2 MG SUBLINGUAL FILM
ORAL_FILM | Freq: Four times a day (QID) | SUBLINGUAL | 0 refills | 30.00000 days | Status: CP
Start: 2023-12-29 — End: 2024-01-28

## 2024-01-06 MED ORDER — SUBOXONE 8 MG-2 MG SUBLINGUAL FILM
ORAL_FILM | Freq: Four times a day (QID) | SUBLINGUAL | 0 refills | 30.00000 days
Start: 2024-01-06 — End: 2024-02-05

## 2024-01-28 ENCOUNTER — Encounter: Admit: 2024-01-28 | Discharge: 2024-01-28 | Payer: Medicaid (Managed Care)

## 2024-01-28 DIAGNOSIS — G8929 Other chronic pain: Principal | ICD-10-CM

## 2024-01-28 DIAGNOSIS — R062 Wheezing: Principal | ICD-10-CM

## 2024-01-28 DIAGNOSIS — M5441 Lumbago with sciatica, right side: Principal | ICD-10-CM

## 2024-01-28 DIAGNOSIS — F1190 Opioid use, unspecified, uncomplicated: Principal | ICD-10-CM

## 2024-01-28 DIAGNOSIS — K760 Fatty (change of) liver, not elsewhere classified: Principal | ICD-10-CM

## 2024-01-28 DIAGNOSIS — M5442 Lumbago with sciatica, left side: Principal | ICD-10-CM

## 2024-01-28 DIAGNOSIS — F418 Other specified anxiety disorders: Principal | ICD-10-CM

## 2024-01-28 MED ORDER — HYDROXYZINE PAMOATE 25 MG CAPSULE
ORAL_CAPSULE | Freq: Three times a day (TID) | ORAL | 1 refills | 10.00000 days | Status: CP | PRN
Start: 2024-01-28 — End: 2024-04-27

## 2024-01-28 MED ORDER — SUBOXONE 8 MG-2 MG SUBLINGUAL FILM
ORAL_FILM | Freq: Four times a day (QID) | SUBLINGUAL | 2 refills | 30.00000 days | Status: CP
Start: 2024-01-28 — End: 2024-04-27

## 2024-01-28 MED ORDER — GABAPENTIN 400 MG CAPSULE
ORAL_CAPSULE | Freq: Four times a day (QID) | ORAL | 5 refills | 30.00000 days | Status: CP
Start: 2024-01-28 — End: 2024-07-26

## 2024-01-28 MED ORDER — ALBUTEROL SULFATE HFA 90 MCG/ACTUATION AEROSOL INHALER
Freq: Four times a day (QID) | RESPIRATORY_TRACT | 0 refills | 0.00000 days | Status: CP | PRN
Start: 2024-01-28 — End: ?

## 2024-02-05 DIAGNOSIS — F1190 Opioid use, unspecified, uncomplicated: Principal | ICD-10-CM

## 2024-03-22 DIAGNOSIS — F418 Other specified anxiety disorders: Principal | ICD-10-CM

## 2024-03-22 MED ORDER — HYDROXYZINE PAMOATE 25 MG CAPSULE
ORAL_CAPSULE | 0 refills | 0.00000 days
Start: 2024-03-22 — End: ?

## 2024-03-22 MED ORDER — ESOMEPRAZOLE MAGNESIUM 40 MG CAPSULE,DELAYED RELEASE
ORAL_CAPSULE | Freq: Every day | ORAL | 0 refills | 0.00000 days
Start: 2024-03-22 — End: ?

## 2024-03-23 MED ORDER — HYDROXYZINE PAMOATE 25 MG CAPSULE
ORAL_CAPSULE | INTRAMUSCULAR | 0 refills | 0.00000 days | Status: CP
Start: 2024-03-23 — End: ?

## 2024-03-28 MED ORDER — ESOMEPRAZOLE MAGNESIUM 40 MG CAPSULE,DELAYED RELEASE
ORAL_CAPSULE | Freq: Every day | ORAL | 0 refills | 90.00000 days | Status: CP
Start: 2024-03-28 — End: ?

## 2024-04-13 DIAGNOSIS — F1190 Opioid use, unspecified, uncomplicated: Principal | ICD-10-CM

## 2024-04-13 MED ORDER — SUBOXONE 8 MG-2 MG SUBLINGUAL FILM
ORAL_FILM | Freq: Four times a day (QID) | SUBLINGUAL | 0 refills | 30.00000 days | Status: CP
Start: 2024-04-13 — End: 2024-05-13

## 2024-05-01 MED ORDER — SUBOXONE 8 MG-2 MG SUBLINGUAL FILM
ORAL_FILM | Freq: Four times a day (QID) | SUBLINGUAL | 0 refills | 30.00000 days
Start: 2024-05-01 — End: 2024-05-31

## 2024-05-02 ENCOUNTER — Encounter: Admit: 2024-05-02 | Discharge: 2024-05-02 | Payer: Medicaid (Managed Care)

## 2024-05-02 DIAGNOSIS — K760 Fatty (change of) liver, not elsewhere classified: Principal | ICD-10-CM

## 2024-05-02 DIAGNOSIS — Z23 Encounter for immunization: Principal | ICD-10-CM

## 2024-05-02 DIAGNOSIS — M5441 Lumbago with sciatica, right side: Principal | ICD-10-CM

## 2024-05-02 DIAGNOSIS — F1190 Opioid use, unspecified, uncomplicated: Principal | ICD-10-CM

## 2024-05-02 DIAGNOSIS — F1010 Alcohol abuse, uncomplicated: Principal | ICD-10-CM

## 2024-05-02 DIAGNOSIS — M5442 Lumbago with sciatica, left side: Principal | ICD-10-CM

## 2024-05-02 DIAGNOSIS — G8929 Other chronic pain: Principal | ICD-10-CM

## 2024-05-02 MED ORDER — SUBOXONE 8 MG-2 MG SUBLINGUAL FILM
ORAL_FILM | Freq: Four times a day (QID) | SUBLINGUAL | 2 refills | 30.00000 days | Status: CP
Start: 2024-05-02 — End: 2024-07-31

## 2024-05-02 MED ORDER — GABAPENTIN 600 MG TABLET
ORAL_TABLET | Freq: Three times a day (TID) | ORAL | 2 refills | 30.00000 days | Status: CP
Start: 2024-05-02 — End: 2024-07-31

## 2024-05-04 DIAGNOSIS — M5442 Lumbago with sciatica, left side: Principal | ICD-10-CM

## 2024-05-04 DIAGNOSIS — M5441 Lumbago with sciatica, right side: Principal | ICD-10-CM

## 2024-05-04 DIAGNOSIS — G8929 Other chronic pain: Principal | ICD-10-CM

## 2024-05-04 MED ORDER — GABAPENTIN 600 MG TABLET
ORAL_TABLET | Freq: Three times a day (TID) | ORAL | 2 refills | 30.00000 days
Start: 2024-05-04 — End: 2024-08-02

## 2024-05-05 MED ORDER — GABAPENTIN 600 MG TABLET
ORAL_TABLET | Freq: Three times a day (TID) | ORAL | 2 refills | 30.00000 days
Start: 2024-05-05 — End: 2024-08-03

## 2024-05-07 DIAGNOSIS — F1190 Opioid use, unspecified, uncomplicated: Principal | ICD-10-CM

## 2024-05-18 ENCOUNTER — Other Ambulatory Visit: Payer: Self-pay

## 2024-05-18 ENCOUNTER — Emergency Department
Admission: EM | Admit: 2024-05-18 | Discharge: 2024-05-18 | Disposition: A | Discharge status: Home / Self Care | Source: Ambulatory Visit | Attending: Emergency Medicine | Admitting: Emergency Medicine

## 2024-05-18 DIAGNOSIS — Z79899 Other long term (current) drug therapy: Secondary | ICD-10-CM | POA: Insufficient documentation

## 2024-05-18 DIAGNOSIS — R251 Tremor, unspecified: Secondary | ICD-10-CM | POA: Insufficient documentation

## 2024-05-18 DIAGNOSIS — E162 Hypoglycemia, unspecified: Secondary | ICD-10-CM | POA: Diagnosis present

## 2024-05-18 LAB — CBG MONITORING, ED: Glucose-Capillary: 108 mg/dL — ABNORMAL HIGH (ref 70–99)

## 2024-05-18 MED ORDER — BLOOD GLUCOSE TEST VI STRP: 1.0000 | ORAL_STRIP | 0 refills | Status: AC

## 2024-05-18 MED ORDER — LANCET DEVICE MISC: 1.0000 | 0 refills | Status: AC

## 2024-05-18 MED ORDER — LANCETS MISC: 1.0000 | 0 refills | Status: AC

## 2024-05-18 MED ORDER — BLOOD GLUCOSE MONITORING SUPPL DEVI: 1.0000 | 0 refills | Status: AC

## 2024-05-18 NOTE — ED Triage Notes (Signed)
 Pt via POV from home. Pt c/o tremors started this AM, reports he will sometimes he will have tremors when his sugar is low. States he is has not checked his sugar this morning but he did eat breakfast. States the tremors are better now but his employer insisted he get checked. Pt is A&OX4 and NAD, ambulatory to triage.

## 2024-05-18 NOTE — ED Provider Notes (Signed)
 "  [facility] Provider Note    Event Date/Time   First MD Initiated Contact with Patient 05/18/24 1013     (approximate)   History   Tremors   HPI  Jared Trujillo is a 44 y.o. male history of polysubstance abuse, hep C, hypoglycemia presents emergency department complaining of tremors prior to eating.  Patient states his sugar dropped he started having tremors.  His employer became concerned sent him to the ED.  Wants to make sure he does not have a condition where he cannot run machinery.  Patient denies any current drug use.  States as soon as he ate breakfast the tremor stopped.  States he has to keep candy in his pocket due to the low glucose levels occasionally.  States he has lost his glucometer.      Physical Exam   Triage Vital Signs: ED Triage Vitals  Encounter Vitals Group     BP 05/18/24 0953 (!) 135/93     Girls Systolic BP Percentile --      Girls Diastolic BP Percentile --      Boys Systolic BP Percentile --      Boys Diastolic BP Percentile --      Pulse Rate 05/18/24 0953 76     Resp 05/18/24 0953 18     Temp 05/18/24 0953 98.1 F (36.7 C)     Temp Source 05/18/24 0953 Oral     SpO2 05/18/24 0953 98 %     Weight 05/18/24 0952 172 lb (78 kg)     Height 05/18/24 0952 5' 9 (1.753 m)     Head Circumference --      Peak Flow --      Pain Score 05/18/24 0952 0     Pain Loc --      Pain Education --      Exclude from Growth Chart --     Most recent vital signs: Vitals:   05/18/24 0953  BP: (!) 135/93  Pulse: 76  Resp: 18  Temp: 98.1 F (36.7 C)  SpO2: 98%     General: Awake, no distress.   CV:  Good peripheral perfusion.  Resp:  Normal effort.  Abd:  No distention.   Other:  No tremors noted, neurovascular intact, cranial nerves II through XII grossly intact   ED Results / Procedures / Treatments   Labs (all labs ordered are listed, but only abnormal results are displayed) Labs Reviewed  CBG MONITORING, ED -  Abnormal; Notable for the following components:      Result Value   Glucose-Capillary 108 (*)    All other components within normal limits     EKG     RADIOLOGY     PROCEDURES:   Procedures  Critical Care:  no Chief Complaint  Patient presents with   Tremors      MEDICATIONS ORDERED IN ED: Medications - No data to display   IMPRESSION / MDM / ASSESSMENT AND PLAN / ED COURSE  I reviewed the triage vital signs and the nursing notes.                              Differential diagnosis includes, but is not limited to, hypoglycemia, tremors, withdrawal  Patient's presentation is most consistent with Patient's presentation is most consistent with acute illness / injury with system symptoms.  Patient's glucose was 108.  Tremors have stopped at this time.  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  He states he basically watches the screen and if there is any problem he stops the machine by pressing the button and then door does not open on the machine until everything has stopped.  I did send a note to his employer stating he can return to work without restrictions.  Sent a prescription for glucometer, test strips, and lancets to his pharmacy.  He is in agreement treatment plan.  Discharged stable condition.      FINAL CLINICAL IMPRESSION(S) / ED DIAGNOSES   Final diagnoses:  Hypoglycemia     Rx / DC Orders   ED Discharge Orders          Ordered    Blood Glucose Monitoring Suppl DEVI  As directed        05/18/24 1023    Glucose Blood (BLOOD GLUCOSE TEST STRIPS) STRP  As directed        05/18/24 1023    Lancet Device MISC  As directed        05/18/24 1023    Lancets MISC  As directed        05/18/24 1023             Note:  This document was prepared using Dragon voice recognition software and may include unintentional dictation errors.    Gasper Devere ORN, PA-C 05/18/24 1030     Willo Dunnings, MD 05/18/24 1451  "

## 2024-05-18 NOTE — Discharge Instructions (Signed)
 Follow-up with your regular doctor as needed.  Return for worsening.

## 2024-05-18 NOTE — ED Notes (Signed)
Verified correct patient and correct discharge papers given. Pt alert and oriented X 4, stable for discharge. RR even and unlabored, color WNL. Discussed discharge instructions and follow-up as directed. Discharge medications discussed, when prescribed. Pt had opportunity to ask questions, and RN available to provide patient and/or family education.   

## 2024-05-31 DIAGNOSIS — G8929 Other chronic pain: Secondary | ICD-10-CM

## 2024-05-31 DIAGNOSIS — M5441 Lumbago with sciatica, right side: Secondary | ICD-10-CM

## 2024-05-31 DIAGNOSIS — M5442 Lumbago with sciatica, left side: Principal | ICD-10-CM

## 2024-05-31 MED ORDER — GABAPENTIN 600 MG TABLET
ORAL_TABLET | Freq: Four times a day (QID) | ORAL | 2 refills | 30.00000 days | Status: CP
Start: 2024-05-31 — End: 2024-08-29
